# Patient Record
Sex: Male | Born: 1945 | Race: White | Hispanic: No | Marital: Single | Smoking: Never smoker
Health system: Southern US, Community
[De-identification: ages and names within clinical notes are randomized; demographics above are authoritative.]

## PROBLEM LIST (undated history)

## (undated) DIAGNOSIS — C4492 Squamous cell carcinoma of skin, unspecified: Secondary | ICD-10-CM

## (undated) DIAGNOSIS — M1990 Unspecified osteoarthritis, unspecified site: Secondary | ICD-10-CM

## (undated) DIAGNOSIS — I2510 Atherosclerotic heart disease of native coronary artery without angina pectoris: Secondary | ICD-10-CM

## (undated) DIAGNOSIS — Z87442 Personal history of urinary calculi: Secondary | ICD-10-CM

## (undated) DIAGNOSIS — I10 Essential (primary) hypertension: Secondary | ICD-10-CM

## (undated) DIAGNOSIS — C801 Malignant (primary) neoplasm, unspecified: Secondary | ICD-10-CM

## (undated) HISTORY — PX: WRIST SURGERY: SHX841

## (undated) HISTORY — PX: NECK MASS EXCISION: SHX2079

## (undated) HISTORY — PX: TONSILLECTOMY: SUR1361

## (undated) HISTORY — PX: ROTATOR CUFF REPAIR: SHX139

## (undated) HISTORY — PX: HIP ARTHROPLASTY: SHX981

---

## 1998-11-29 ENCOUNTER — Encounter: Payer: Self-pay | Admitting: Internal Medicine

## 1998-11-29 ENCOUNTER — Emergency Department (HOSPITAL_COMMUNITY): Admission: EM | Admit: 1998-11-29 | Discharge: 1998-11-29 | Payer: Self-pay | Admitting: Internal Medicine

## 2004-10-02 DIAGNOSIS — C210 Malignant neoplasm of anus, unspecified: Secondary | ICD-10-CM

## 2004-10-02 HISTORY — DX: Malignant neoplasm of anus, unspecified: C21.0

## 2004-11-22 ENCOUNTER — Ambulatory Visit: Payer: Self-pay | Admitting: Hematology and Oncology

## 2004-11-23 ENCOUNTER — Ambulatory Visit: Admission: RE | Admit: 2004-11-23 | Discharge: 2005-01-29 | Payer: Self-pay | Admitting: *Deleted

## 2004-11-25 ENCOUNTER — Encounter (INDEPENDENT_AMBULATORY_CARE_PROVIDER_SITE_OTHER): Payer: Self-pay | Admitting: Specialist

## 2004-11-25 ENCOUNTER — Ambulatory Visit (HOSPITAL_COMMUNITY): Admission: RE | Admit: 2004-11-25 | Discharge: 2004-11-25 | Payer: Self-pay | Admitting: General Surgery

## 2005-01-09 ENCOUNTER — Ambulatory Visit: Payer: Self-pay | Admitting: Hematology and Oncology

## 2005-02-08 ENCOUNTER — Ambulatory Visit (HOSPITAL_BASED_OUTPATIENT_CLINIC_OR_DEPARTMENT_OTHER): Admission: RE | Admit: 2005-02-08 | Discharge: 2005-02-08 | Payer: Self-pay | Admitting: *Deleted

## 2005-02-21 ENCOUNTER — Ambulatory Visit: Admission: RE | Admit: 2005-02-21 | Discharge: 2005-02-21 | Payer: Self-pay | Admitting: *Deleted

## 2005-02-24 ENCOUNTER — Ambulatory Visit: Payer: Self-pay | Admitting: Hematology and Oncology

## 2005-03-13 ENCOUNTER — Ambulatory Visit (HOSPITAL_COMMUNITY): Admission: RE | Admit: 2005-03-13 | Discharge: 2005-03-13 | Payer: Self-pay | Admitting: Hematology and Oncology

## 2005-03-15 ENCOUNTER — Ambulatory Visit (HOSPITAL_COMMUNITY): Admission: RE | Admit: 2005-03-15 | Discharge: 2005-03-15 | Payer: Self-pay | Admitting: Hematology and Oncology

## 2005-04-12 ENCOUNTER — Ambulatory Visit (HOSPITAL_COMMUNITY): Admission: RE | Admit: 2005-04-12 | Discharge: 2005-04-12 | Payer: Self-pay | Admitting: General Surgery

## 2005-04-12 ENCOUNTER — Encounter (INDEPENDENT_AMBULATORY_CARE_PROVIDER_SITE_OTHER): Payer: Self-pay | Admitting: Specialist

## 2005-04-20 ENCOUNTER — Ambulatory Visit: Payer: Self-pay | Admitting: Hematology and Oncology

## 2005-04-25 ENCOUNTER — Ambulatory Visit: Admission: RE | Admit: 2005-04-25 | Discharge: 2005-04-25 | Payer: Self-pay | Admitting: *Deleted

## 2005-07-06 ENCOUNTER — Ambulatory Visit: Payer: Self-pay | Admitting: Hematology and Oncology

## 2005-07-17 ENCOUNTER — Ambulatory Visit (HOSPITAL_COMMUNITY): Admission: RE | Admit: 2005-07-17 | Discharge: 2005-07-17 | Payer: Self-pay | Admitting: Hematology and Oncology

## 2005-10-02 DIAGNOSIS — D126 Benign neoplasm of colon, unspecified: Secondary | ICD-10-CM

## 2005-10-02 HISTORY — PX: COLONOSCOPY: SHX174

## 2005-10-02 HISTORY — DX: Benign neoplasm of colon, unspecified: D12.6

## 2005-11-08 ENCOUNTER — Ambulatory Visit: Payer: Self-pay | Admitting: Hematology and Oncology

## 2005-11-21 ENCOUNTER — Ambulatory Visit (HOSPITAL_COMMUNITY): Admission: RE | Admit: 2005-11-21 | Discharge: 2005-11-21 | Payer: Self-pay | Admitting: Hematology and Oncology

## 2005-12-01 ENCOUNTER — Ambulatory Visit (HOSPITAL_COMMUNITY): Admission: RE | Admit: 2005-12-01 | Discharge: 2005-12-01 | Payer: Self-pay | Admitting: Hematology and Oncology

## 2006-03-27 ENCOUNTER — Ambulatory Visit: Payer: Self-pay | Admitting: Internal Medicine

## 2006-04-10 ENCOUNTER — Ambulatory Visit: Payer: Self-pay | Admitting: Internal Medicine

## 2006-04-10 ENCOUNTER — Encounter (INDEPENDENT_AMBULATORY_CARE_PROVIDER_SITE_OTHER): Payer: Self-pay | Admitting: Specialist

## 2006-04-18 ENCOUNTER — Ambulatory Visit: Payer: Self-pay | Admitting: Hematology and Oncology

## 2006-04-23 LAB — CBC WITH DIFFERENTIAL/PLATELET
BASO%: 0.3 % (ref 0.0–2.0)
EOS%: 0.8 % (ref 0.0–7.0)
HCT: 39.9 % (ref 38.7–49.9)
LYMPH%: 23.7 % (ref 14.0–48.0)
MCH: 31.3 pg (ref 28.0–33.4)
MCHC: 34.2 g/dL (ref 32.0–35.9)
MCV: 91.6 fL (ref 81.6–98.0)
MONO%: 7.7 % (ref 0.0–13.0)
NEUT%: 67.5 % (ref 40.0–75.0)
Platelets: 162 10*3/uL (ref 145–400)
lymph#: 0.9 10*3/uL (ref 0.9–3.3)

## 2006-04-23 LAB — COMPREHENSIVE METABOLIC PANEL
ALT: 20 U/L (ref 0–40)
AST: 18 U/L (ref 0–37)
Alkaline Phosphatase: 131 U/L — ABNORMAL HIGH (ref 39–117)
BUN: 24 mg/dL — ABNORMAL HIGH (ref 6–23)
Creatinine, Ser: 1.37 mg/dL (ref 0.40–1.50)
Total Bilirubin: 0.4 mg/dL (ref 0.3–1.2)

## 2006-04-27 ENCOUNTER — Ambulatory Visit (HOSPITAL_COMMUNITY): Admission: RE | Admit: 2006-04-27 | Discharge: 2006-04-27 | Payer: Self-pay | Admitting: Hematology and Oncology

## 2006-08-29 ENCOUNTER — Ambulatory Visit: Payer: Self-pay | Admitting: Hematology and Oncology

## 2006-08-31 LAB — COMPREHENSIVE METABOLIC PANEL
ALT: 39 U/L (ref 0–53)
AST: 25 U/L (ref 0–37)
CO2: 29 mEq/L (ref 19–32)
Chloride: 101 mEq/L (ref 96–112)
Sodium: 143 mEq/L (ref 135–145)
Total Bilirubin: 0.5 mg/dL (ref 0.3–1.2)
Total Protein: 7.1 g/dL (ref 6.0–8.3)

## 2006-08-31 LAB — CBC WITH DIFFERENTIAL/PLATELET
BASO%: 0.3 % (ref 0.0–2.0)
LYMPH%: 22.8 % (ref 14.0–48.0)
MCHC: 34.4 g/dL (ref 32.0–35.9)
MONO#: 0.4 10*3/uL (ref 0.1–0.9)
RBC: 4.74 10*6/uL (ref 4.20–5.71)
WBC: 4.9 10*3/uL (ref 4.0–10.0)
lymph#: 1.1 10*3/uL (ref 0.9–3.3)

## 2006-08-31 LAB — CEA: CEA: 1.2 ng/mL (ref 0.0–5.0)

## 2006-11-22 ENCOUNTER — Encounter: Admission: RE | Admit: 2006-11-22 | Discharge: 2006-11-22 | Payer: Self-pay | Admitting: Orthopedic Surgery

## 2006-11-27 ENCOUNTER — Ambulatory Visit (HOSPITAL_COMMUNITY): Admission: RE | Admit: 2006-11-27 | Discharge: 2006-11-28 | Payer: Self-pay | Admitting: Orthopedic Surgery

## 2007-01-25 ENCOUNTER — Ambulatory Visit: Payer: Self-pay | Admitting: Hematology and Oncology

## 2007-01-30 LAB — CBC WITH DIFFERENTIAL/PLATELET
BASO%: 0.1 % (ref 0.0–2.0)
Basophils Absolute: 0 10*3/uL (ref 0.0–0.1)
EOS%: 0.5 % (ref 0.0–7.0)
HCT: 41.1 % (ref 38.7–49.9)
HGB: 14.5 g/dL (ref 13.0–17.1)
LYMPH%: 21.4 % (ref 14.0–48.0)
MCH: 32 pg (ref 28.0–33.4)
MCHC: 35.2 g/dL (ref 32.0–35.9)
NEUT%: 70.2 % (ref 40.0–75.0)
Platelets: 140 10*3/uL — ABNORMAL LOW (ref 145–400)

## 2007-01-30 LAB — COMPREHENSIVE METABOLIC PANEL
ALT: 35 U/L (ref 0–53)
AST: 24 U/L (ref 0–37)
BUN: 29 mg/dL — ABNORMAL HIGH (ref 6–23)
CO2: 29 mEq/L (ref 19–32)
Calcium: 9.5 mg/dL (ref 8.4–10.5)
Chloride: 101 mEq/L (ref 96–112)
Creatinine, Ser: 1.24 mg/dL (ref 0.40–1.50)
Total Bilirubin: 0.6 mg/dL (ref 0.3–1.2)

## 2007-02-01 ENCOUNTER — Encounter: Payer: Self-pay | Admitting: Hematology and Oncology

## 2007-02-01 ENCOUNTER — Inpatient Hospital Stay (HOSPITAL_COMMUNITY): Admission: AD | Admit: 2007-02-01 | Discharge: 2007-02-04 | Payer: Self-pay | Admitting: Orthopedic Surgery

## 2007-02-18 ENCOUNTER — Encounter: Admission: RE | Admit: 2007-02-18 | Discharge: 2007-02-18 | Payer: Self-pay | Admitting: Orthopedic Surgery

## 2007-03-05 ENCOUNTER — Inpatient Hospital Stay (HOSPITAL_COMMUNITY): Admission: RE | Admit: 2007-03-05 | Discharge: 2007-03-08 | Payer: Self-pay | Admitting: Orthopedic Surgery

## 2007-03-05 ENCOUNTER — Encounter (INDEPENDENT_AMBULATORY_CARE_PROVIDER_SITE_OTHER): Payer: Self-pay | Admitting: Orthopedic Surgery

## 2007-04-26 ENCOUNTER — Inpatient Hospital Stay (HOSPITAL_COMMUNITY): Admission: RE | Admit: 2007-04-26 | Discharge: 2007-04-29 | Payer: Self-pay | Admitting: Orthopedic Surgery

## 2007-07-26 ENCOUNTER — Ambulatory Visit: Payer: Self-pay | Admitting: Hematology and Oncology

## 2007-07-30 LAB — CBC WITH DIFFERENTIAL/PLATELET
Basophils Absolute: 0 10*3/uL (ref 0.0–0.1)
HCT: 40.2 % (ref 38.7–49.9)
HGB: 14 g/dL (ref 13.0–17.1)
LYMPH%: 22.2 % (ref 14.0–48.0)
MCHC: 34.9 g/dL (ref 32.0–35.9)
MONO#: 0.4 10*3/uL (ref 0.1–0.9)
NEUT%: 68.4 % (ref 40.0–75.0)
Platelets: 162 10*3/uL (ref 145–400)
WBC: 4.6 10*3/uL (ref 4.0–10.0)
lymph#: 1 10*3/uL (ref 0.9–3.3)

## 2007-07-30 LAB — COMPREHENSIVE METABOLIC PANEL
ALT: 22 U/L (ref 0–53)
BUN: 23 mg/dL (ref 6–23)
CO2: 29 mEq/L (ref 19–32)
Calcium: 10 mg/dL (ref 8.4–10.5)
Chloride: 101 mEq/L (ref 96–112)
Creatinine, Ser: 1.11 mg/dL (ref 0.40–1.50)
Glucose, Bld: 100 mg/dL — ABNORMAL HIGH (ref 70–99)
Total Bilirubin: 0.6 mg/dL (ref 0.3–1.2)

## 2007-07-30 LAB — LACTATE DEHYDROGENASE: LDH: 152 U/L (ref 94–250)

## 2007-08-01 ENCOUNTER — Ambulatory Visit (HOSPITAL_COMMUNITY): Admission: RE | Admit: 2007-08-01 | Discharge: 2007-08-01 | Payer: Self-pay | Admitting: Hematology and Oncology

## 2007-08-23 IMAGING — CT CT 3D INDEPENDENT WKST
3 of 4 series · 16 of 35 positions shown, 19 images · IV contrast (agent unspecified)
Comparison: none

CLINICAL DATA: Left wrist fracture.  Preoperative planning.
CT OF THE LEFT WRIST WITHOUT CONTRAST:
TECHNIQUE: Multidetector CT imaging was performed according to the standard protocol.  Multiplanar CT image reconstructions were also generated.

[Series 102: upper ext · axial · 0.35mm/px · z∈[+127,+247]mm · 8 of 369 slices shown, 10 images]
[im 34/369  soft-tissue]
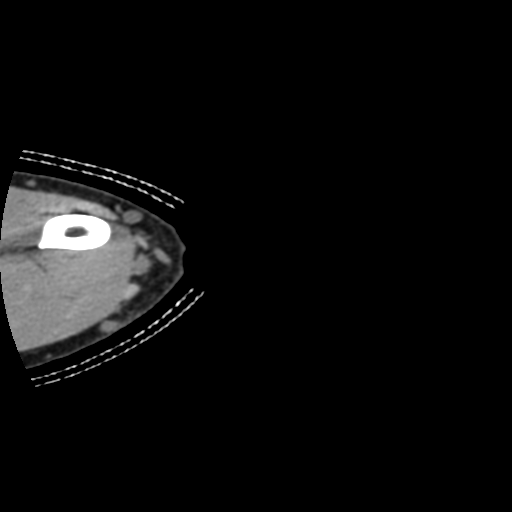
[im 34/369  bone]
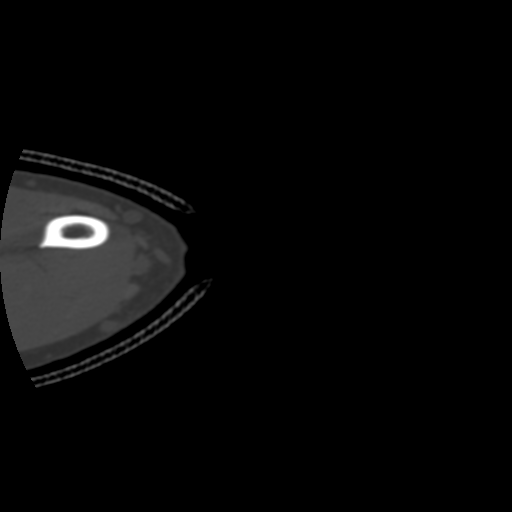
[im 67/369  bone]
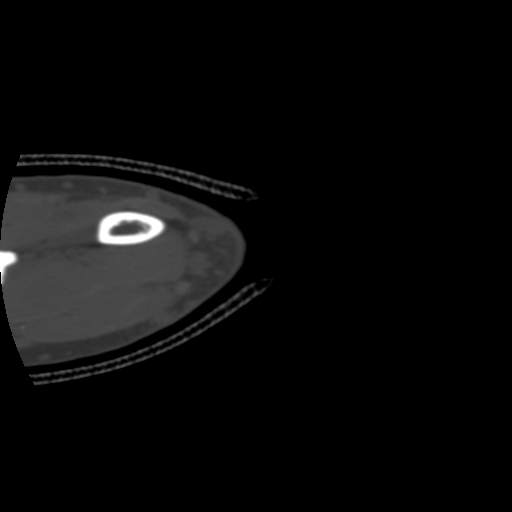
[im 134/369  bone]
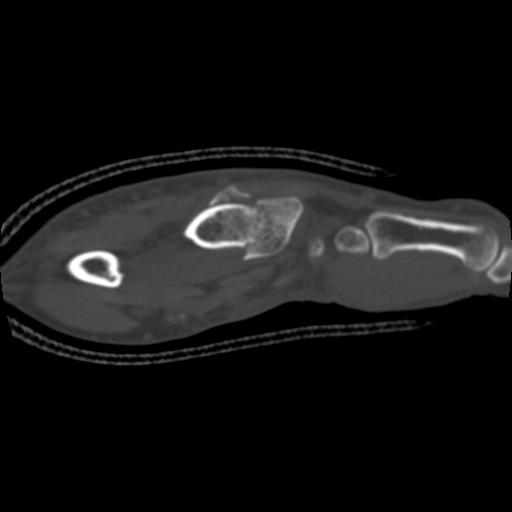
[im 168/369  bone]
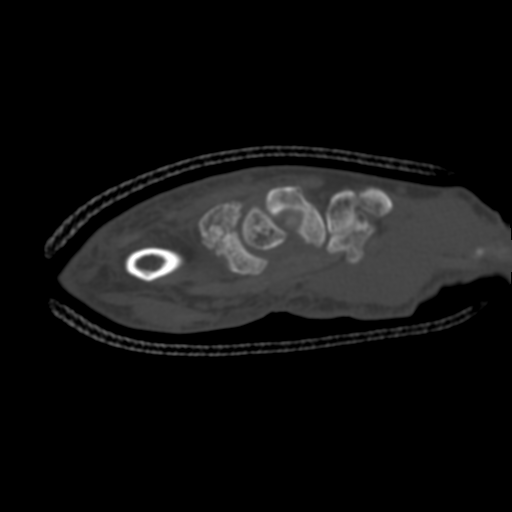
[im 201/369  soft-tissue]
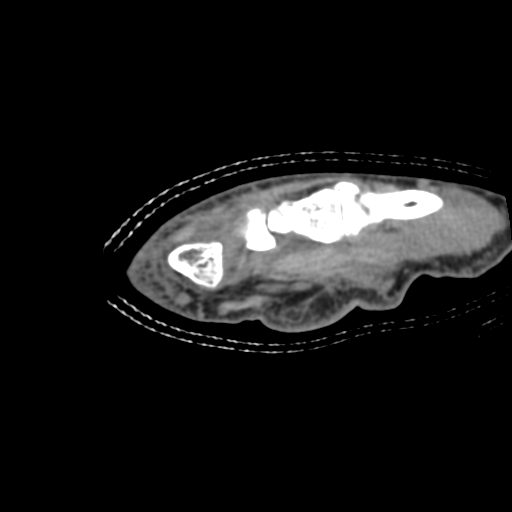
[im 201/369  bone]
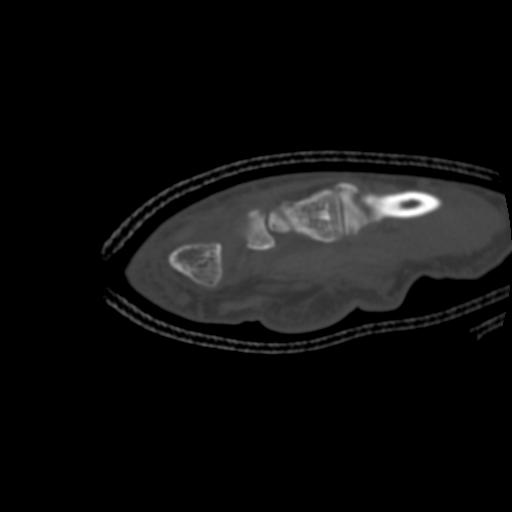
[im 235/369  bone]
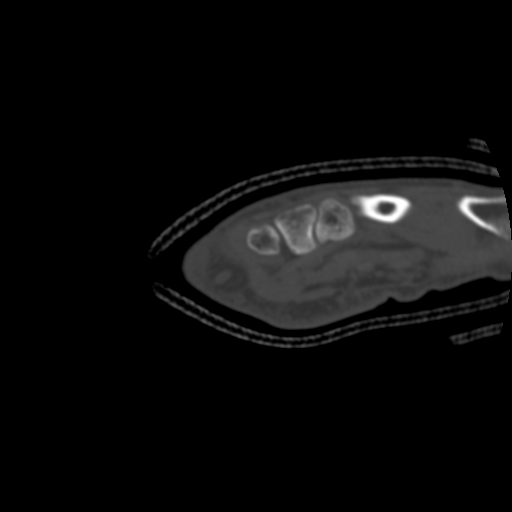
[im 302/369  bone]
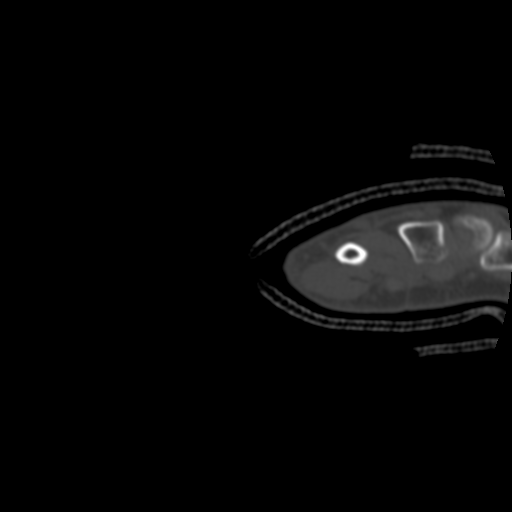
[im 335/369  bone]
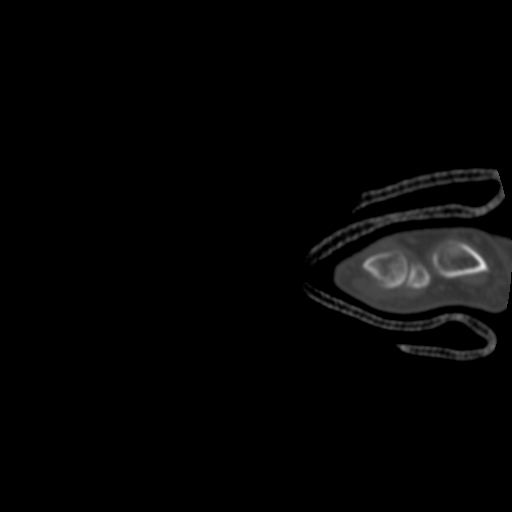

[Series 200: axial lt wrist · sagittal · 0.35mm/px · 5 of 60 slices shown, 6 images]
[im 20/60  bone]
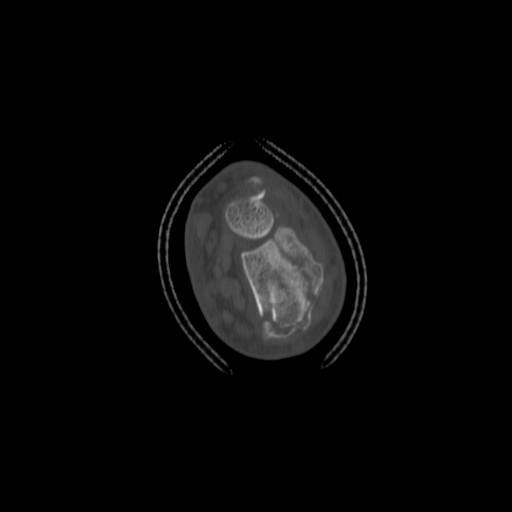
[im 25/60  bone]
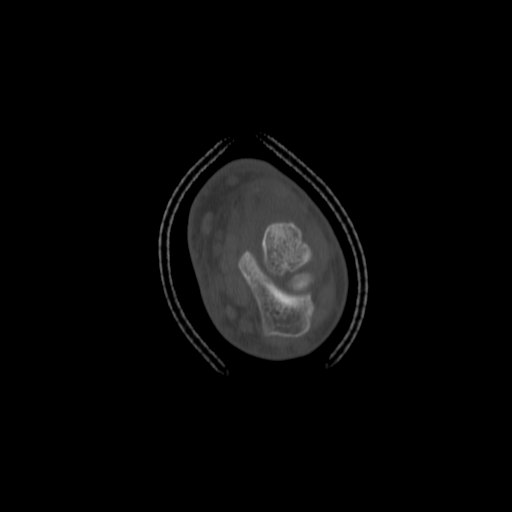
[im 30/60  soft-tissue]
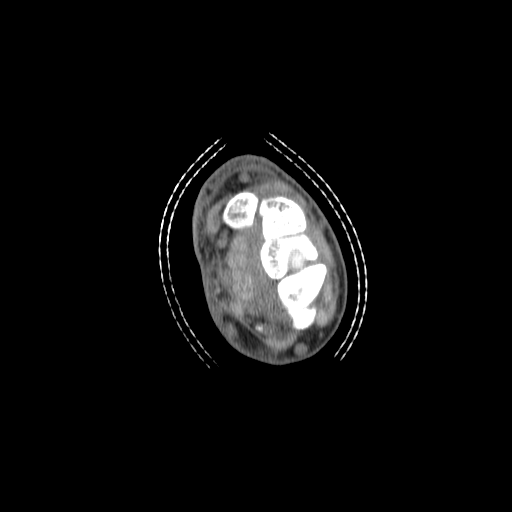
[im 30/60  bone]
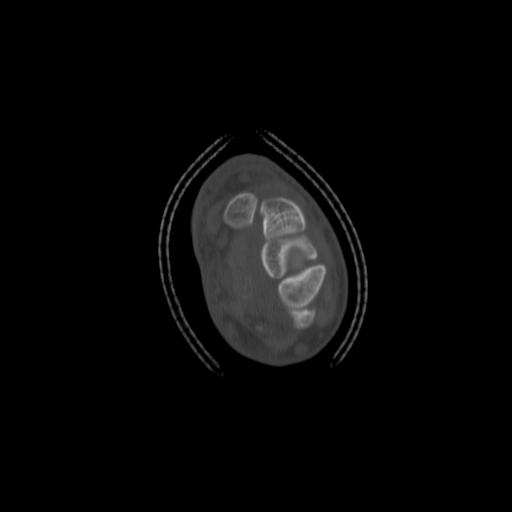
[im 35/60  bone]
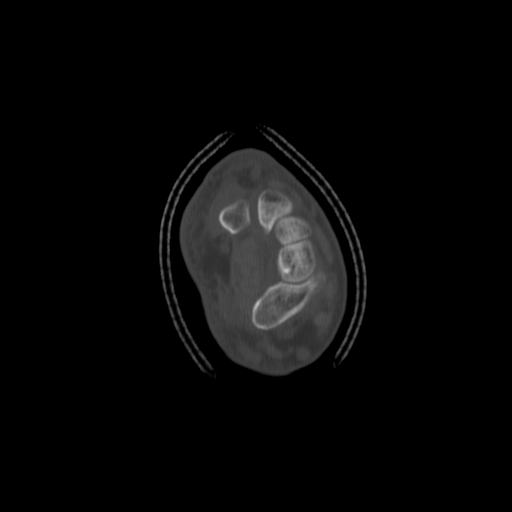
[im 40/60  bone]
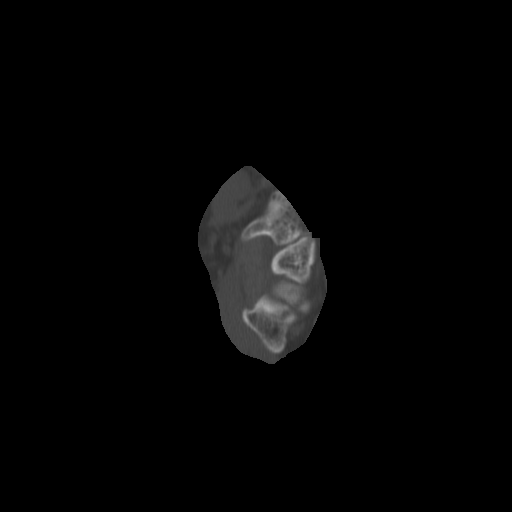

[Series 601: coronal bone · coronal · 0.35mm/px · 3 of 60 slices shown]
[im 12/60  bone]
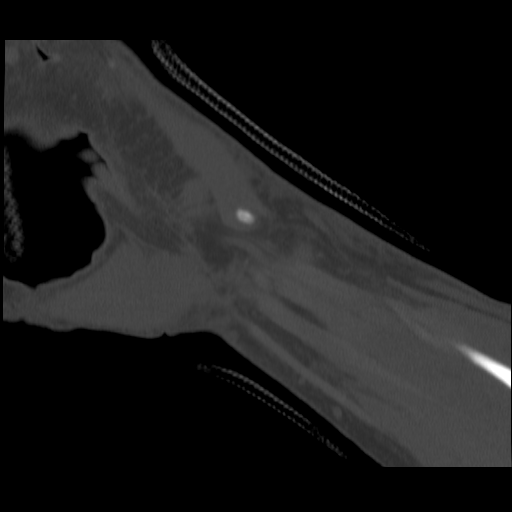
[im 24/60  bone]
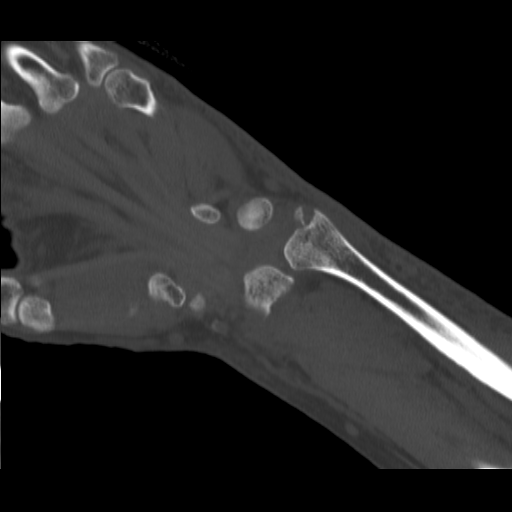
[im 36/60  bone]
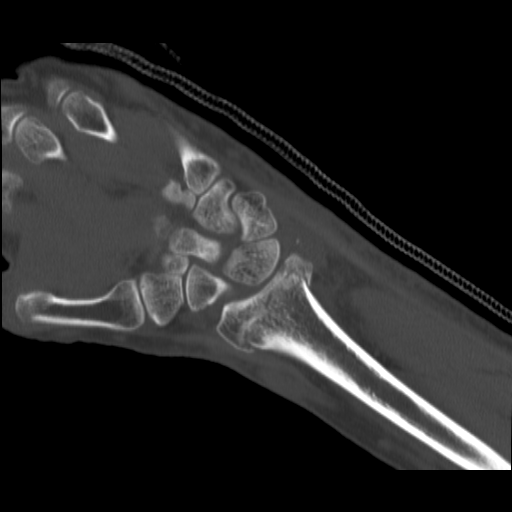

[16 of 35 positions shown; findings below may reference images not displayed]

FINDINGS: There is a fracture of the ulnar styloid which is displaced 2 to 3 mm.  There is a comminuted fracture of the distal radius.  The distal radial articular surface shows only very minimal dorsal tilt.  The fracture line does extend to the articular surface.  There is slight impaction of the fracture fragments.  I do not see a fracture of the carpal bones.  I do not see any widening of the spaces between the proximal row.  The proximal metacarpals appear normal.
IMPRESSION: 1.  Minimally displaced fracture of the ulnar styloid. 
2.  Comminuted fracture of the distal radius.  Only a few degrees of dorsal tilt of the distal radial articular surface.  The articular surface is largely intact with only one fracture line extending to it in a fairly smooth nature.  
3-DIMENSIONAL CT IMAGE RENDERING ON INDEPENDENT WORKSTATION:
3-dimensional CT images were rendered by post-processing of the original CT data on an independent workstation.  The 3-dimensional CT images were interpreted, and findings were reported in the accompanying complete CT report for this study.

## 2008-02-11 ENCOUNTER — Ambulatory Visit: Payer: Self-pay | Admitting: Hematology and Oncology

## 2010-10-23 ENCOUNTER — Encounter: Payer: Self-pay | Admitting: Hematology and Oncology

## 2011-02-14 NOTE — Op Note (Signed)
NAME:  Anthony, Grant NO.:  [id number]   MEDICAL RECORD NO.:  [id number]          PATIENT TYPE:  INP   LOCATION:  1608                         FACILITY:  [facility]   PHYSICIAN:  Madlyn Frankel. Charlann Boxer, M.D.  DATE OF BIRTH:  01/08/1946   DATE OF PROCEDURE:  04/26/2007  DATE OF DISCHARGE:                               OPERATIVE REPORT   PREOPERATIVE DIAGNOSIS:  Failed left hip surgery after femoral neck  fracture and percutaneous cannulated screw fixation.   POSTOPERATIVE DIAGNOSIS:  Failed left hip surgery after femoral neck  fracture and percutaneous cannulated screw fixation.   PROCEDURE:  Conversion of previous left hip surgery to a left total hip  replacement.   COMPONENTS USED:  DePuy hip system with a size 56 Pinnacle cup, two  cancellous bone screws, 36 metal liner, size 4 standard Trilock stem  with a 36 +5 Delta ceramic ball.   SURGEON:  Madlyn Frankel. Charlann Boxer, M.D.   ASSISTANT:  [name]. Mann, P.A.-C.   ANESTHESIA:  General.   ESTIMATED BLOOD LOSS:  400 mL.   DRAINS:  None.   COMPLICATIONS:  None.   INDICATIONS FOR PROCEDURE:  [name] is a 65 year old patient known  to me for insufficiency fractures.  This may be related to chemotherapy  type treatment for colon cancer.  He had had an insufficiency fracture  on this left side that was treated with a cannulated screw fixation.  He  has had persistent with problems afterwards.  He is over four months  out.  In the interim, he developed a right hip insufficiency fracture.  After discussing with him his current treatment options at the time, he  wished to proceed with hip replacement as an option.  His right hip has  subsequently done extremely well six weeks out without pain.  His left  hip has persistently bothered him and he wished to proceed with  converting to hip replacement surgery.  The risks and benefits were  reviewed as he had proceeded with the right side in the past.  Consent  was  obtained.   PROCEDURE IN DETAIL:  The patient was brought to the operating theater.  Once adequate anesthesia and preoperative antibiotics, Ancef, were  administered, the patient was positioned in the right lateral decubitus  position with the left side up.  The left lower extremity was pre-  scrubbed and then prepped and draped in a sterile fashion.  A lateral  type incision was made fore a posterior approach to the hip.  I extended  it a little bit longer down the shaft of the femur in order to remove  the screws.   Attention was first directed to hip exposure.  The short external  rotators were identified and taken down from the posterior capsule and  capsulotomy was carried out preserving the posterior leaflet to repair  anatomically the superior leaflet.  With the hip exposed, I did  dislocate the hip at this point before removing the hardware to make  sure I did not create a stress fracture by removing the hardware.  There  was no significant  motion through this fracture site.  However, the  femoral head was noted to have some condylar change.   Following dislocation of the hip, I did reduce it.  I then attended to  removing the screws.  The screws were digitally palpated and a small  incision made in the vastus lateralis fascia.  The screws were removed  without difficulty.  At this point, the hip was dislocated.  The neck  osteotomy was subsequently made based on anatomical landmarks and using  the broach of the template.  I then began broaching the femur after  reaming the canal to prevent fat emboli.  The femur was initially  broached to a size 4 with approximately 25 degrees of anteversion.  I  then kept the stem in place to protect the stem from the retractors.  Attention was now directed to the acetabulum.  Following acetabular  exposure and labrectomy, reaming commenced with a 45 reamer.  I carried  this all the way up to the 55 reamer until I got a good bony purchase.   At this point, there was excellent bony bed preparation with cancellous  bone.  A size 56 Pinnacle cup was then impacted at 35 degrees of  abduction and 20 degrees of forward flexion.  Two cancellous screws were  used to supplement this fixation.  A neutral liner trial was placed.  Trial reduction was carried out with the size 4 femur with the 36 +5  ball.  There was some thought to using a 40 metal liner and there was  none available at this time, so we used a 36 liner, this is what we used  on the contralateral hip.  The hip appeared to be very stable and  compared to the contralateral leg, the leg lengths appeared to be equal.  There was very stable range of motion with internal rotation up to 70-80  degrees without any impingement or subluxation.   At this point, all trial components were removed.  The hole eliminator  was placed followed by the 36 neutral metal liner.  I then impacted the  size 4 standard Trilock stem to the level of the neck cut.  The 36 trial  was then carried out again to assure stability.  Given this, the final  36 +5 ball was impacted into a clean and dry trunnion and the hip  reduced.  The hip was irrigated throughout the case and again at this  point.  I then reapproximated the posterior capsule to the superior leaf  with #1 Ethibond.  5 mL of FloSeal was injected in the capsular tissue.  I then reapproximated the iliotibial band using a running #1 Vicryl.  A  #1 Vicryl was run through the gluteal fascia.  The remainder of the  wound was closed in layers with 2-0 Vicryl and running 4-0 Monocryl.  The hip was cleaned, dried, and dressed sterilely with a Mepilex  dressing after Steri-Strips.  The patient was brought to the recovery  room extubated in stable condition.      Madlyn Frankel Charlann Boxer, M.D.  Electronically Signed     MDO/MEDQ  D:  04/26/2007  T:  04/27/2007  Job:  [id number]

## 2011-02-14 NOTE — Discharge Summary (Signed)
NAME:  [name], [name] NO.:  [id number]   MEDICAL RECORD NO.:  [id number]          PATIENT TYPE:  INP   LOCATION:  5010                         FACILITY:  [facility]   PHYSICIAN:  Madlyn Frankel. Charlann Boxer, M.D.  DATE OF BIRTH:  08-05-1946   DATE OF ADMISSION:  02/01/2007  DATE OF DISCHARGE:  02/04/2007                               DISCHARGE SUMMARY   ADMISSION DIAGNOSES:  1. Right hip pain, rule out stress fracture.  2. Colon cancer.  3. Hypertension.  4. Hypercholesterolemia.   DISCHARGE DIAGNOSES:  1. Right hip pain with no stress fracture.  Muscle tear found on MRI.  2. Colon cancer.  3. Hypertension.  4. Hypercholesterolemia.   CONSULTING PHYSICIAN:  Interventional radiology for hip joint  aspiration.   HISTORY OF PRESENT ILLNESS:  Mr. [name] is a 65 year old gentleman who  had a fall on his left hip recently where an MRI revealed a stress  fracture in his left hip.  He had a closed reduction and percutaneous  screw fixation of his left hip.  He has done fairly well with this and  had a rapid onset of increased right hip pain, unable to bear weight and  really function.  He has been using crutches.  After presenting to our  office on May 2nd and reporting his history, Dr. Charlann Boxer had him admitted  to rule out stress fracture treatment with increasing pain with  weightbearing.   LABORATORY DATA:  CBC on admission:  Hematocrit 40.2, white blood cells  5.6.  Coagulation:  INR is 1.1, PT was 14.3.  Chemistry:  His glucose is  100, his BUN was 25.  Kidney function:  GFR was at 52, calcium 9.5.  Synovial fluid cell count from right hip showed hazy appearance, 92  monocyte and macrophages.  Right hip cultures:  No organisms, rare blood  cells, predominantly mononuclear.   Cardiology:  EKG normal sinus rhythm.  Radiology:  Fluoroscopy utilized  using surgery.  MRI right hip without contrast; impression:  1. Unusual marrow signal abnormalities involving the right hip and      right acetabulum.  These might be bone infarcts and may be related      to previous radiation, no definite fracture, avascular necrosis.  2. Marked edema-like signal changes in fluid along the obturator      externus muscle, which is likely a muscle tear and may account for      this patient's hip pain.  There is also a small right hip joint      effusion.  Right hip joint arthrocentesis under fluoroscopic      guidance; impression showed that it yielded 4 mL of clear synovial      fluid for test.   HOSPITAL COURSE:  The patient admitted under Dr. Nilsa Nutting service.  The  MRI was performed on his first day in.  Asked for right hip aspiration  to rule out infection.  If this was negative, then a predinsone 12-pack  would be prescribed.  He remained afebrile throughout his course of  stay.  On the 4th when we saw him,  he was okay, but was still hurting in  his right hip.  After negative culture, prednisone was started.  By the  next day, he was feeling better and was ready for discharge home with  the use of the prednisone dosepak.   DISCHARGE DISPOSITION:  Discharged home in stable and improved condition  without infection.   DISCHARGE ACTIVITY:  Weightbearing as tolerated on his right hip.   DISCHARGE MEDICATIONS:  1. Atenolol 25 mg one p.o. daily.  2. Simvastatin 4 mg one p.o. daily.  3. HCTZ 25 mg one p.o. daily.  4. Avelox 400 mg one p.o. daily.  5. Durahist 45 mg p.o. daily.  6. Predinsone 5 mg 12-day dosepak to take as directed.   DISCHARGE INSTRUCTIONS:  Follow-up with Dr. Charlann Boxer, [phone], in three  weeks.     ______________________________  [name] [address], [place]      Madlyn Frankel. Charlann Boxer, M.D.  Electronically Signed    BLM/MEDQ  D:  04/15/2007  T:  04/16/2007  Job:  [id number]

## 2011-02-14 NOTE — H&P (Signed)
NAME:  [name], [name] NO.:  [id number]   MEDICAL RECORD NO.:  [id number]        PATIENT TYPE:  LINP   LOCATION:                               FACILITY:  [facility]   PHYSICIAN:  Madlyn Frankel. Charlann Boxer, M.D.  DATE OF BIRTH:  1945/11/01   DATE OF ADMISSION:  04/26/2007  DATE OF DISCHARGE:                              HISTORY & PHYSICAL   PROCEDURE:  Conversion of left hip pinning to left total hip  arthroplasty.   HISTORY OF PRESENT ILLNESS:  Mr. [name] is a 65 year old male with a  history of a left hip insufficiency fracture with left hip pinning.  Since this, he has had significant amount of groin pain, pain with  ambulation.  It has been persistent, progressive, and has reached the  point to where it has significantly affected his ability to perform  activities of daily living.  Conservative treatments have failed.  He  has been presurgically assessed for a left total hip replacement as he  has recently also had a right total hip replacement as well.   PAST MEDICAL HISTORY:  1. Status post right total hip replacement.  2. Osteoporosis.  3. Osteopenia.  4. Colon cancer.  5. Hypertension.  6. Hypercholesteremia.   PAST SURGICAL HISTORY:  Left hip percutaneous screw in March 2008.   FAMILY HISTORY:  Noncontributory.   SOCIAL HISTORY:  He is single.  Primary caregiver:  Home health care  versus skilled nursing facility rehab.   ALLERGIES:  NO KNOWN DRUG ALLERGIES.   MEDICATIONS:  1. Atenolol 25 mg daily.  2. Simvastatin 40 mg daily.  3. HCTZ 25 mg daily.   REVIEW OF SYSTEMS:  MUSCULOSKELETAL:  Left wrist fracture and fixation  on Feb 19, 2007 by Dr. Melvyn Grant.  Right total hip replacement March 05, 2007 by Dr. Durene Romans.  Otherwise see HPI.   PHYSICAL EXAMINATION:  Pulse 72, respirations 18, blood pressure 124/88.  GENERAL:  Awake, alert and oriented, well developed, well nourished, no  acute distress.  NECK:  Supple.  No carotid bruits.  CHEST/LUNGS:   Clear to auscultation bilaterally.  BREASTS:  Deferred.  HEART:  Regular rate and rhythm without gallops, clicks, rubs or  murmurs.  ABDOMEN:  Soft, nontender, nondistended.  Bowel sounds present.  GENITOURINARY:  Deferred.  LEFT LOWER EXTREMITY:  Painful to hip internal range of motion.  Lateral-  sided numbness.  SKIN:  Intact.  No cellulitis.  Dorsalis pedis pulse positive.  NEUROLOGIC:  Intact distal sensibilities.   LABS:  EKG, chest x-ray all pending presurgical clearance.   IMPRESSION:  Left hip degenerative changes associated with insufficiency  fracture and percutaneous screw fixation.   PLAN OF ACTION:  Conversion of left hip pinning to left total hip  arthroplasty on April 26, 2007 by surgeon Dr. Durene Romans.  Risks and  complications were discussed.  Questions were encouraged, answered, and  reviewed.     ______________________________  [name] [address], [place]      Madlyn Frankel. Charlann Boxer, M.D.  Electronically Signed    BLM/MEDQ  D:  04/25/2007  T:  04/26/2007  Job:  [id number] 

## 2011-02-14 NOTE — Op Note (Signed)
NAME:  [name], [name] NO.:  [id number]   MEDICAL RECORD NO.:  [id number]          PATIENT TYPE:  INP   LOCATION:  0005                         FACILITY:  [facility]   PHYSICIAN:  Madlyn Frankel. Charlann Boxer, M.D.  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   PROCEDURE:  Right total hip replacement.   COMPONENTS USED:  DePuy hip system, size 54 Pinnacle cup, 46 neutral  Marathon liner, 2 cancellous bone screws, Tri-Lock size 7 standard stem  with a 36 +1.5 Delta ceramic ball.   SURGEON:  Madlyn Frankel. Charlann Boxer, M.D.   ASSISTANT:  Dwyane Luo, P.A.   ANESTHESIA:  General.   BLOOD LOSS:  250.   DRAINS:  Zero.   COMPLICATIONS:  None.   INDICATIONS FOR PROCEDURE:  [name] is a 65 year old gentleman who  has a history of colon cancer.  He has been a patient of mine now for  currently bilateral insufficiency fractures.  He has had a history of  internal fixation and stabilization of a femoral neck fracture on the  left side, but he has got at least delayed if not nonunion in this area.  He has had progressive pain as he was recovering from this and he  developed right hip pain and the same sort of pattern happen.  He had  evidence of edema within the acetabulum and femoral head.  I discussed  with him treatment options at this point and based on his current  treatment and clinical course of his left hip, after reviewing the  options, he wishes to proceed with total hip replacement.  We discussed  the risks and benefits.  He is entertaining the idea of having the left  hip done as well.   Consent was obtained.  We discussed the risks of infection,  dislocation,  DVT and need for revision surgery.   PROCEDURE IN DETAIL:  The patient was brought to the operative theater.  Once adequate anesthesia and preoperative antibiotics, 2 grams Ancef,  were administered, the patient was positioned in the left lateral  decubitus position with the right side up.  We carefully positioned his  left upper extremity; he has had recent left distal radius and ulnar  fracture and currently in a long arm cast.   His right hip area was then prescrubbed and prepped and draped in a  sterile fashion.  The lateral-based incision was made for posterior  approach to the hip.  Iliotibial band and gluteus fascia were incised in  line with the incision posteriorly.  The short external rotators were  taken down separate from the posterior capsule.  An L capsulotomy was  made, preserving the posterior capsule for later anatomic repair as well  as to protect against the sciatic nerve with retractors.  At this point, the hip was flexed and internally rotated, displacing the  fracture quite easily.  There was not a significant amount of force  required for this.  It was noted to be more of a subcapital-type  pattern; for this reason, I went ahead and did a neck osteotomy into the  trochanteric fossa, removing this neck fragment and saving it to sent to  Pathology, given his history of colon cancer.  The femoral head was then  removed without difficulty using a corkscrew; it was sent for pathology  too, based on history of colon cancer, to make sure there was no  metastatic disease in this area.   At this point, attention was first directed to the femur.  Preparation  included use of a box osteotome set in anteversion at 20 degrees,  slightly bent, more so than his native position.  I then used the hand  reamer, opened up the canal, irrigated the canal to prevent fat emboli.  I then began broaching with size 1 and carried it all the way up  initially to a size 6.   Based on the angle of the broach but, I seat  this broach securely and then used a calcar miller to plane off this  neck region.  At this point, I packed off the femur and attended to the  acetabulum.  Acetabular exposure was carried out including labrectomy.  There were no significant ulcerations within the cartilage in this  acetabular side.  The acetabulum was reamed first with a 45 reamer and  then carried up sequentially up to 53.  At this point, I had very good  bone preparation and an anatomic position.  The final 54 cup was  impacted with good initial secure fit.  I did place 2 cancellous screw,  based on its insufficiency nature, to help stabilize initially the cup.   At this point, a neutral liner trial was placed.  Attention was then  redirected back to the femur.  I impacted the 6 stem down.  I went ahead  and did a trial reduction with it.  I used the standard offset neck and  as opposed to the high offset.  I trialed with the 36 +1.5 ball.  Leg  lengths appeared to be very equal compared the down leg pain.  He was  noted to have a little bit of lengthening on his right lower extremity  as compared to the left preop anyway, perhaps related to some of his  fracture fixation on the left side.   The hip was very stable in extension and external with no signs of  impingement.  In the sleep position, it was very stable with flexion,  neutral abduction and internal rotation.  There was some impingement on  the acetabulum at a full 90 degrees, but at 70 degrees of flexion and  internal rotation, there was good range of motion to about 60-70 degrees  with minimal subluxation.   At this point, I dislocated the hip and we opened up the metal liner.  Central hole eliminator was placed and the manhole cover placed.  I did  check the stability of the prosthesis and felt there was a little bit of rotation; for this reason, I went ahead and broached up to a size 7  broach, which sat at  the level of my new neck cut that was milled.   The final size 7 standard stem was then opened.  I did do a repeat  trial  reduction to make sure I was happy with the range of motion and once I  was convinced it was stable, I placed the final 36 +1.5 Delta ceramic  head.  Hip was irrigated throughout this case and again at this point.  I removed some of the soft tissues from the anterior aspect, which was  redundant-type capsule tissue.  I then reapproximated the posterior  capsule using #1 Ethibond.  The remainder of the iliotibial band was  reapproximated using #1 Ethibond and #1 Vicryl was used on gluteal  fascia.  The reaming wound was closed in layers with 2-0 Vicryl and a 4-0 running  Monocryl.  The patient was then brought to recovery room and extubated  stable condition.  The hip was cleaned, dried and dressed sterilely with  Steri-Strips and Mepilex dressing.      Madlyn Frankel Charlann Boxer, M.D.  Electronically Signed     MDO/MEDQ  D:  03/05/2007  T:  03/05/2007  Job:  [id number]

## 2011-02-14 NOTE — H&P (Signed)
NAME:  [name], OLGUIN NO.:  [id number]   MEDICAL RECORD NO.:  [id number]          PATIENT TYPE:  INP   LOCATION:  NA                           FACILITY:  [facility]   PHYSICIAN:  Madlyn Frankel. Charlann Boxer, M.D.  DATE OF BIRTH:  12-Oct-1945   DATE OF ADMISSION:  03/05/2007  DATE OF DISCHARGE:                              HISTORY & PHYSICAL   PROCEDURE:  Right total hip replacement.   HISTORY OF PRESENT ILLNESS:  Right hip groin pain.   HISTORY OF PRESENT ILLNESS:  A 65 year old male with a history of  persistent progressive right hip pain, was secondary to insufficiency  fracture.  It has been refractory to conservative treatments at this  point.  It has diminished his quality of life and he has been  presurgically assessed for right total hip replacement.   He does have a recent history of a left wrist fracture.  He has been  wearing a splint but it did have open reduction internal fixation on the  22nd by Dr. Melvyn Novas IV and should pose no problem for long-term success  of this surgery.   PAST MEDICAL HISTORY:  1. Osteoporosis.  2. Osteopenia.  3. Colon cancer.  4. Hypertension.  5. Hypercholesteremia.   PAST SURGICAL HISTORY:  Left hip percutaneous screw in March 2008.   FAMILY HISTORY:  Noncontributory.   SOCIAL HISTORY:  He is single.  Primary caregiver home health care or  skilled nursing facility rehab.   ALLERGIES:  NO KNOWN DRUG ALLERGIES.   MEDICATIONS:  1. Atenolol 25 mg daily.  2. Simvastatin 40 mg daily.  3. Hydrochlorothiazide 25 mg daily.   REVIEW OF SYSTEMS:  MUSCULOSKELETAL:  Left wrist fracture and fixation  on Feb 19, 2007, by Dr. Melvyn Novas IV.  Otherwise see HPI.   PHYSICAL EXAMINATION:  VITAL SIGNS:  Pulse 64, respirations 18, blood  pressure 124/88.  GENERAL:  Awake, alert and oriented, well-developed, well-nourished, no  acute distress.  Does have sling and cast splint on left arm.  NECK:  Supple.  No carotid bruits.  CHEST:  Lungs clear  to auscultation bilaterally.  BREASTS:  Deferred.  HEART:  Regular rate and rhythm without gallops, clicks, rubs or  murmurs.  ABDOMEN:  Soft, nontender, nondistended.  Bowel sounds present.  GENITOURINARY:  Deferred.  RIGHT LOWER EXTREMITY:  Painful hip internal range of motion.  SKIN:  Intact.  No cellulitis.  Dorsalis pedis pulse positive.  NEUROLOGIC:  Intact distal sensibilities.   Labs, EKG, chest x-ray all pending presurgical clearance.   IMPRESSION:  Right hip insufficiency fracture.   PLAN OF ACTION:  Right total hip replacement on March 05, 2007, by  surgeon, Dr. Durene Romans.  Risks and complications were discussed.  Questions were encouraged, answered and reviewed.     ______________________________  [name] [address], [place]      Madlyn Frankel. Charlann Boxer, M.D.  Electronically Signed    BLM/MEDQ  D:  03/01/2007  T:  03/01/2007  Job:  [id number]

## 2011-02-17 NOTE — Discharge Summary (Signed)
NAME:  [name], BAIL NO.:  [id number]   MEDICAL RECORD NO.:  [id number]          PATIENT TYPE:  INP   LOCATION:  1608                         FACILITY:  [facility]   PHYSICIAN:  Madlyn Frankel. Charlann Boxer, M.D.  DATE OF BIRTH:  [birth year]   DATE OF ADMISSION:  04/26/2007  DATE OF DISCHARGE:  04/29/2007                               DISCHARGE SUMMARY   ADMISSION DIAGNOSES:  1. Failed left hip surgery.  2. Status post right total hip replacement.  3. Osteoporosis.  4. Osteopenia.  5. Colon cancer.  6. Hypertension.  7. Hypercholesterolemia.   DISCHARGE DIAGNOSES:  1. Failed left hip pinning.  2. Status post right total hip replacement.  3. Osteoporosis.  4. Osteopenia.  5. Colon cancer.  6. Hypertension.  7. Hypercholesterolemia.  8. Postoperative hypokalemia.   CONSULTATIONS:  None.   PROCEDURE:  Conversion of left total hip replacement.   SURGEON:  Madlyn Frankel. Charlann Boxer, M.D.   ASSISTANT:  [name]. Mann, PA.   COMPLICATIONS:  Metal on ceramic.   BRIEF HISTORY OF PRESENT ILLNESS:  [name] is a 65 year old male  with a history of a left hip insufficiency fracture with a left hip  pinning.  Since then, he has had a significant amount of groin pain and  pain with ambulation.  It has been persistent and progressive.  He  reached the point of it significantly affecting his ability to perform  activities of daily living.  Pre-surgery he was assessed for left total  hip replacement, as he has also recently had a right total hip  replacement.   LABS PRE-ADMISSION:  CBC:  Hematocrit 40.8.  Postop day #1, 28.1.  Postop day #2, 26.6.  Postop day #3, 27.2 and stable.  Coags normal.  Routine chemistry:  Glucose 107 on admission, BUN 28, mild dehydration.   On postop day #1, mild hypokalemia at 3.3.  Glucose was 119.   On postop day #2, potassium came back up to 3.4, glucose at 112.  At  discharge, potassium 3.6, glucose 101 and stable.   Kidney function preadmission,  GFR 52.  Returned to normal at admission  to greater than 60.  GI workup normal.  UA negative.   RADIOLOGY:  Portable pelvis showed interval left total hip arthroplasty  with no adverse effects.   HOSPITAL COURSE:  Patient underwent conversion of left total hip  replacement.  Tolerated the procedure well.  Was admitted to the  orthopedic floor.  Remained neurovascularly intact throughout his course  of stay.  He remained afebrile throughout.  He was weightbearing as  tolerated with use of a rolling walker.  He progressed nicely during his  course of stay.  He was ambulating at least 150 feet prior to discharge.  He experienced some mild hypokalemia.  He was replenished and stable  upon discharge.  DVT prophylaxis with Lovenox was begun on postop day  #1.  The dressing was changed on a daily basis without significant  drainage from the wound.   On day #3, there were no complaints, ready to go.  Ambulated well.  DVT  prophylaxis  was underway.  He was stable.   DISCHARGE DISPOSITION:  Discharged home.   DISCHARGE WOUND CARE:  Keep dry.   DISCHARGE DIET:  Regular.   DISCHARGE FOLLOWUP:  With Dr. Charlann Boxer for a wound check in two weeks.   DISCHARGE MEDICATIONS:  1. Lovenox 40 mg subcu q.24h. x11 days.  2. Enteric-coated aspirin 325 mg p.o. daily x4 weeks after Lovenox      completed.  3. Robaxin 500 mg p.o. q.6h.  4. Colace 100 mg p.o. b.i.d.  5. MiraLax 17 gm p.o. daily.  6. Iron 325 mg p.o. t.i.d. x3 weeks.  7. Vicodin 5/325 1-2 p.o. q.4-6h. p.r.n. pain.  8. Atenolol 25 mg p.o. q.a.m.  9. HCTZ 25 mg 1 p.o. q.a.m.  10.Vitamin D 1000 mg p.o. q.a.m.  11.Vitamin B12 500 mcg p.o. q.a.m.  12.Simvastatin 40 mg p.o. daily.     ______________________________  [name] [address], [place]      Madlyn Frankel. Charlann Boxer, M.D.  Electronically Signed    BLM/MEDQ  D:  05/27/2007  T:  05/27/2007  Job:  [id number]

## 2011-07-17 LAB — CBC
HCT: 26.6 — ABNORMAL LOW
HCT: 40.8
Hemoglobin: 13.9
Hemoglobin: 9.4 — ABNORMAL LOW
Hemoglobin: 9.9 — ABNORMAL LOW
MCHC: 34
MCHC: 35.3
MCV: 88.3
MCV: 90.1
Platelets: 211
RBC: 3.17 — ABNORMAL LOW
RBC: 4.53
RDW: 15.4 — ABNORMAL HIGH
RDW: 15.7 — ABNORMAL HIGH
WBC: 5.3

## 2011-07-17 LAB — URINALYSIS, ROUTINE W REFLEX MICROSCOPIC
Bilirubin Urine: NEGATIVE
Nitrite: NEGATIVE
Specific Gravity, Urine: 1.024
Urobilinogen, UA: 0.2
pH: 5.5

## 2011-07-17 LAB — BASIC METABOLIC PANEL WITH GFR
BUN: 10
BUN: 8
CO2: 28
CO2: 30
Calcium: 8.4
Calcium: 8.8
Chloride: 100
Chloride: 102
Creatinine, Ser: 0.88
Creatinine, Ser: 0.92
GFR calc non Af Amer: >60
GFR calc non Af Amer: >60
Glucose, Bld: 101 — ABNORMAL HIGH
Glucose, Bld: 112 — ABNORMAL HIGH
Potassium: 3.4 — ABNORMAL LOW
Potassium: 3.6
Sodium: 136
Sodium: 137

## 2011-07-17 LAB — COMPREHENSIVE METABOLIC PANEL WITH GFR
ALT: 39
AST: 32
Albumin: 4.2
Alkaline Phosphatase: 115
BUN: 28 — ABNORMAL HIGH
CO2: 31
Calcium: 10.3
Chloride: 100
Creatinine, Ser: 1.38
GFR calc non Af Amer: 52 — ABNORMAL LOW
Glucose, Bld: 107 — ABNORMAL HIGH
Potassium: 3.6
Sodium: 142
Total Bilirubin: 0.9
Total Protein: 7.9

## 2011-07-17 LAB — PROTIME-INR
INR: 0.9
Prothrombin Time: 12.7

## 2011-07-17 LAB — BASIC METABOLIC PANEL
Calcium: 8.4
GFR calc Af Amer: >60
GFR calc non Af Amer: >60
Potassium: 3 — ABNORMAL LOW
Sodium: 135

## 2011-07-17 LAB — TYPE AND SCREEN
ABO/RH(D): O POS
Antibody Screen: NEGATIVE

## 2011-07-17 LAB — HEMOGLOBIN AND HEMATOCRIT, BLOOD: HCT: 27.2 — ABNORMAL LOW

## 2011-07-20 LAB — CBC
HCT: 33.1 — ABNORMAL LOW
Hemoglobin: 11.5 — ABNORMAL LOW
Hemoglobin: 12.4 — ABNORMAL LOW
MCHC: 34.7
MCV: 91.1
MCV: 91.4
RBC: 3.62 — ABNORMAL LOW
RBC: 3.94 — ABNORMAL LOW
RDW: 14.7 — ABNORMAL HIGH
WBC: 5.4

## 2011-07-20 LAB — BASIC METABOLIC PANEL
CO2: 31
CO2: 33 — ABNORMAL HIGH
Calcium: 9
Chloride: 96
Chloride: 97
Creatinine, Ser: 1.19
GFR calc Af Amer: >60
GFR calc Af Amer: >60
Glucose, Bld: 112 — ABNORMAL HIGH
Sodium: 134 — ABNORMAL LOW
Sodium: 138

## 2015-08-03 NOTE — H&P (Signed)
  [name] is an 69 y.o. male.    Chief Complaint: left shoulder pain  HPI: Pt is a 69 y.o. male complaining of left shoulder pain for multiple years. Pain had continually increased since the beginning. X-rays in the clinic show rotator cuff tear left shoulder. Pt has tried various conservative treatments which have failed to alleviate their symptoms, including injections and therapy. Various options are discussed with the patient. Risks, benefits and expectations were discussed with the patient. Patient understand the risks, benefits and expectations and wishes to proceed with surgery.   PCP:  No primary care provider on file.  D/C Plans: Home  PMH: No past medical history on file.  PSH: No past surgical history on file.  Social History:  has no tobacco, alcohol, and drug history on file.  Allergies:  Allergies not on file  Medications: No current facility-administered medications for this encounter.   No current outpatient prescriptions on file.    No results found for this or any previous visit (from the past 48 hour(s)). No results found.  ROS: Pain with rom of the left upper extremity  Physical Exam:  Alert and oriented 69 y.o. male in no acute distress Cranial nerves 2-12 intact Cervical spine: full rom with no tenderness, nv intact distally Chest: active breath sounds bilaterally, no wheeze rhonchi or rales Heart: regular rate and rhythm, no murmur Abd: non tender non distended with active bowel sounds Hip is stable with rom  Left upper extremity with moderate weakness with ER  No rashes or edema nv intact distally  Assessment/Plan Assessment: left rotator cuff tear   Plan: Patient will undergo a left rotator cuff repair by Dr. Veverly Fells at [facility]. Risks benefits and expectations were discussed with the patient. Patient understand risks, benefits and expectations and wishes to proceed.

## 2015-08-06 ENCOUNTER — Encounter (HOSPITAL_COMMUNITY)
Admission: RE | Admit: 2015-08-06 | Discharge: 2015-08-06 | Disposition: A | Discharge status: Home / Self Care | Payer: Non-veteran care | Source: Ambulatory Visit | Attending: Orthopedic Surgery | Admitting: Orthopedic Surgery

## 2015-08-06 ENCOUNTER — Encounter (HOSPITAL_COMMUNITY): Payer: Self-pay

## 2015-08-06 DIAGNOSIS — M75102 Unspecified rotator cuff tear or rupture of left shoulder, not specified as traumatic: Secondary | ICD-10-CM | POA: Insufficient documentation

## 2015-08-06 DIAGNOSIS — M19012 Primary osteoarthritis, left shoulder: Secondary | ICD-10-CM | POA: Diagnosis not present

## 2015-08-06 DIAGNOSIS — M94212 Chondromalacia, left shoulder: Secondary | ICD-10-CM | POA: Diagnosis not present

## 2015-08-06 DIAGNOSIS — I10 Essential (primary) hypertension: Secondary | ICD-10-CM | POA: Diagnosis not present

## 2015-08-06 DIAGNOSIS — Z01812 Encounter for preprocedural laboratory examination: Secondary | ICD-10-CM | POA: Diagnosis present

## 2015-08-06 DIAGNOSIS — Z79899 Other long term (current) drug therapy: Secondary | ICD-10-CM | POA: Diagnosis not present

## 2015-08-06 DIAGNOSIS — X58XXXA Exposure to other specified factors, initial encounter: Secondary | ICD-10-CM | POA: Diagnosis not present

## 2015-08-06 DIAGNOSIS — S43432A Superior glenoid labrum lesion of left shoulder, initial encounter: Secondary | ICD-10-CM | POA: Diagnosis not present

## 2015-08-06 HISTORY — DX: Malignant (primary) neoplasm, unspecified: C80.1

## 2015-08-06 HISTORY — DX: Essential (primary) hypertension: I10

## 2015-08-06 LAB — CBC
HEMATOCRIT: 41.1 % (ref 39.0–52.0)
HEMOGLOBIN: 13.6 g/dL (ref 13.0–17.0)
MCH: 31.2 pg (ref 26.0–34.0)
MCHC: 33.1 g/dL (ref 30.0–36.0)
MCV: 94.3 fL (ref 78.0–100.0)
Platelets: 130 10*3/uL — ABNORMAL LOW (ref 150–400)
RBC: 4.36 MIL/uL (ref 4.22–5.81)
RDW: 13.7 % (ref 11.5–15.5)
WBC: 3.7 10*3/uL — ABNORMAL LOW (ref 4.0–10.5)

## 2015-08-06 LAB — BASIC METABOLIC PANEL
ANION GAP: 5 (ref 5–15)
BUN: 21 mg/dL — ABNORMAL HIGH (ref 6–20)
CALCIUM: 9.8 mg/dL (ref 8.9–10.3)
CHLORIDE: 114 mmol/L — AB (ref 101–111)
CO2: 24 mmol/L (ref 22–32)
Creatinine, Ser: 1.29 mg/dL — ABNORMAL HIGH (ref 0.61–1.24)
GFR calc Af Amer: >60 mL/min (ref >60)
GFR calc non Af Amer: 55 mL/min — ABNORMAL LOW (ref >60)
GLUCOSE: 103 mg/dL — AB (ref 65–99)
Potassium: 4.2 mmol/L (ref 3.5–5.1)
Sodium: 143 mmol/L (ref 135–145)

## 2015-08-13 ENCOUNTER — Ambulatory Visit (HOSPITAL_COMMUNITY)
Admission: RE | Admit: 2015-08-13 | Discharge: 2015-08-13 | Disposition: A | Discharge status: Home / Self Care | Payer: Non-veteran care | Source: Ambulatory Visit | Attending: Orthopedic Surgery | Admitting: Orthopedic Surgery

## 2015-08-13 ENCOUNTER — Encounter (HOSPITAL_COMMUNITY)
Admission: RE | Disposition: A | Discharge status: Home / Self Care | Payer: Self-pay | Source: Ambulatory Visit | Attending: Orthopedic Surgery

## 2015-08-13 ENCOUNTER — Encounter (HOSPITAL_COMMUNITY): Payer: Self-pay | Admitting: General Practice

## 2015-08-13 ENCOUNTER — Ambulatory Visit (HOSPITAL_COMMUNITY): Payer: Non-veteran care | Admitting: Certified Registered Nurse Anesthetist

## 2015-08-13 DIAGNOSIS — M75102 Unspecified rotator cuff tear or rupture of left shoulder, not specified as traumatic: Secondary | ICD-10-CM | POA: Diagnosis not present

## 2015-08-13 DIAGNOSIS — M94212 Chondromalacia, left shoulder: Secondary | ICD-10-CM | POA: Insufficient documentation

## 2015-08-13 DIAGNOSIS — M19012 Primary osteoarthritis, left shoulder: Secondary | ICD-10-CM | POA: Diagnosis not present

## 2015-08-13 DIAGNOSIS — I10 Essential (primary) hypertension: Secondary | ICD-10-CM | POA: Insufficient documentation

## 2015-08-13 DIAGNOSIS — S43432A Superior glenoid labrum lesion of left shoulder, initial encounter: Secondary | ICD-10-CM | POA: Diagnosis not present

## 2015-08-13 DIAGNOSIS — X58XXXA Exposure to other specified factors, initial encounter: Secondary | ICD-10-CM | POA: Insufficient documentation

## 2015-08-13 DIAGNOSIS — Z79899 Other long term (current) drug therapy: Secondary | ICD-10-CM | POA: Insufficient documentation

## 2015-08-13 HISTORY — PX: SHOULDER ARTHROSCOPY WITH SUBACROMIAL DECOMPRESSION AND OPEN ROTATOR C: SHX5688

## 2015-08-13 SURGERY — SHOULDER ARTHROSCOPY WITH SUBACROMIAL DECOMPRESSION AND OPEN ROTATOR CUFF REPAIR, OPEN BICEPS TENDON REPAIR
Anesthesia: General | Site: Shoulder | Laterality: Left

## 2015-08-13 MED ORDER — LIDOCAINE-EPINEPHRINE (PF) 1.5 %-1:200000 IJ SOLN
INTRAMUSCULAR | Status: DC | PRN
  Administered 2015-08-13: 10 mL via PERINEURAL

## 2015-08-13 MED ORDER — CHLORHEXIDINE GLUCONATE 4 % EX LIQD: 60.0000 mL | Freq: Once | CUTANEOUS | Status: DC

## 2015-08-13 MED ORDER — METHOCARBAMOL 500 MG PO TABS: 500.0000 mg | ORAL_TABLET | Freq: Three times a day (TID) | ORAL | Status: DC | PRN

## 2015-08-13 MED ORDER — MIDAZOLAM HCL 2 MG/2ML IJ SOLN: 1.0000 mg | Freq: Once | INTRAMUSCULAR | Status: DC

## 2015-08-13 MED ORDER — FENTANYL CITRATE (PF) 100 MCG/2ML IJ SOLN
50.0000 ug | Freq: Once | INTRAMUSCULAR | Status: AC
  Administered 2015-08-13: 50 ug via INTRAVENOUS

## 2015-08-13 MED ORDER — EPHEDRINE SULFATE 50 MG/ML IJ SOLN
INTRAMUSCULAR | Status: AC
  Filled 2015-08-13: qty 1

## 2015-08-13 MED ORDER — MIDAZOLAM HCL 2 MG/2ML IJ SOLN
INTRAMUSCULAR | Status: AC
  Administered 2015-08-13: 1 mg
  Filled 2015-08-13: qty 2

## 2015-08-13 MED ORDER — KETOROLAC TROMETHAMINE 30 MG/ML IJ SOLN
30.0000 mg | Freq: Once | INTRAMUSCULAR | Status: AC
  Administered 2015-08-13: 30 mg via INTRAVENOUS

## 2015-08-13 MED ORDER — GLYCOPYRROLATE 0.2 MG/ML IJ SOLN
INTRAMUSCULAR | Status: DC | PRN
  Administered 2015-08-13: 0.4 mg via INTRAVENOUS
  Administered 2015-08-13: 0.2 mg via INTRAVENOUS

## 2015-08-13 MED ORDER — KETOROLAC TROMETHAMINE 30 MG/ML IJ SOLN
INTRAMUSCULAR | Status: AC
  Filled 2015-08-13: qty 1

## 2015-08-13 MED ORDER — CEFAZOLIN SODIUM-DEXTROSE 2-3 GM-% IV SOLR
2.0000 g | INTRAVENOUS | Status: AC
  Administered 2015-08-13: 2 g via INTRAVENOUS
  Filled 2015-08-13: qty 50

## 2015-08-13 MED ORDER — FENTANYL CITRATE (PF) 100 MCG/2ML IJ SOLN
INTRAMUSCULAR | Status: DC | PRN
Start: 2015-08-13 — End: 2015-08-13
  Administered 2015-08-13: 50 ug via INTRAVENOUS

## 2015-08-13 MED ORDER — ROCURONIUM BROMIDE 50 MG/5ML IV SOLN
INTRAVENOUS | Status: AC
  Filled 2015-08-13: qty 1

## 2015-08-13 MED ORDER — OXYCODONE-ACETAMINOPHEN 5-325 MG PO TABS: 1.0000 | ORAL_TABLET | ORAL | Status: DC | PRN

## 2015-08-13 MED ORDER — LACTATED RINGERS IV SOLN
INTRAVENOUS | Status: DC
  Administered 2015-08-13 (×3): via INTRAVENOUS

## 2015-08-13 MED ORDER — ROCURONIUM BROMIDE 100 MG/10ML IV SOLN
INTRAVENOUS | Status: DC | PRN
  Administered 2015-08-13: 30 mg via INTRAVENOUS

## 2015-08-13 MED ORDER — PROPOFOL 10 MG/ML IV BOLUS
INTRAVENOUS | Status: DC | PRN
  Administered 2015-08-13: 100 mg via INTRAVENOUS

## 2015-08-13 MED ORDER — BUPIVACAINE-EPINEPHRINE (PF) 0.25% -1:200000 IJ SOLN
INTRAMUSCULAR | Status: AC
  Filled 2015-08-13: qty 30

## 2015-08-13 MED ORDER — SODIUM CHLORIDE 0.9 % IR SOLN
Status: DC | PRN
  Administered 2015-08-13 (×2): 3000 mL

## 2015-08-13 MED ORDER — PHENYLEPHRINE HCL 10 MG/ML IJ SOLN
INTRAMUSCULAR | Status: DC | PRN
  Administered 2015-08-13: 80 ug via INTRAVENOUS

## 2015-08-13 MED ORDER — FENTANYL CITRATE (PF) 100 MCG/2ML IJ SOLN
INTRAMUSCULAR | Status: AC
  Administered 2015-08-13: 50 ug via INTRAVENOUS
  Filled 2015-08-13: qty 2

## 2015-08-13 MED ORDER — LIDOCAINE HCL (CARDIAC) 20 MG/ML IV SOLN
INTRAVENOUS | Status: DC | PRN
  Administered 2015-08-13: 100 mg via INTRAVENOUS

## 2015-08-13 MED ORDER — BUPIVACAINE-EPINEPHRINE 0.25% -1:200000 IJ SOLN
INTRAMUSCULAR | Status: DC | PRN
  Administered 2015-08-13: 5 mL

## 2015-08-13 MED ORDER — PROPOFOL 10 MG/ML IV BOLUS
INTRAVENOUS | Status: AC
  Filled 2015-08-13: qty 20

## 2015-08-13 MED ORDER — LIDOCAINE HCL (CARDIAC) 20 MG/ML IV SOLN
INTRAVENOUS | Status: AC
  Filled 2015-08-13: qty 5

## 2015-08-13 MED ORDER — MIDAZOLAM HCL 2 MG/2ML IJ SOLN
INTRAMUSCULAR | Status: AC
  Filled 2015-08-13: qty 4

## 2015-08-13 MED ORDER — NEOSTIGMINE METHYLSULFATE 10 MG/10ML IV SOLN
INTRAVENOUS | Status: DC | PRN
  Administered 2015-08-13: 3 mg via INTRAVENOUS

## 2015-08-13 MED ORDER — EPHEDRINE SULFATE 50 MG/ML IJ SOLN
INTRAMUSCULAR | Status: DC | PRN
  Administered 2015-08-13 (×4): 10 mg via INTRAVENOUS

## 2015-08-13 MED ORDER — PROMETHAZINE HCL 25 MG/ML IJ SOLN: 6.2500 mg | INTRAMUSCULAR | Status: DC | PRN

## 2015-08-13 MED ORDER — 0.9 % SODIUM CHLORIDE (POUR BTL) OPTIME
TOPICAL | Status: DC | PRN
  Administered 2015-08-13: 1000 mL

## 2015-08-13 MED ORDER — BUPIVACAINE HCL (PF) 0.5 % IJ SOLN
INTRAMUSCULAR | Status: DC | PRN
  Administered 2015-08-13: 20 mL via PERINEURAL

## 2015-08-13 MED ORDER — FENTANYL CITRATE (PF) 250 MCG/5ML IJ SOLN
INTRAMUSCULAR | Status: AC
  Filled 2015-08-13: qty 5

## 2015-08-13 MED ORDER — HYDROMORPHONE HCL 1 MG/ML IJ SOLN: 0.2500 mg | INTRAMUSCULAR | Status: DC | PRN

## 2015-08-13 MED ORDER — ONDANSETRON HCL 4 MG/2ML IJ SOLN
INTRAMUSCULAR | Status: DC | PRN
  Administered 2015-08-13: 4 mg via INTRAVENOUS

## 2015-08-13 MED ORDER — SUCCINYLCHOLINE CHLORIDE 20 MG/ML IJ SOLN
INTRAMUSCULAR | Status: AC
  Filled 2015-08-13: qty 1

## 2015-08-13 MED ORDER — ONDANSETRON HCL 4 MG/2ML IJ SOLN
INTRAMUSCULAR | Status: AC
  Filled 2015-08-13: qty 2

## 2015-08-13 MED ORDER — PHENYLEPHRINE HCL 10 MG/ML IJ SOLN
10.0000 mg | INTRAVENOUS | Status: DC | PRN
  Administered 2015-08-13: 20 ug/min via INTRAVENOUS

## 2015-08-13 MED ORDER — PHENYLEPHRINE 40 MCG/ML (10ML) SYRINGE FOR IV PUSH (FOR BLOOD PRESSURE SUPPORT)
PREFILLED_SYRINGE | INTRAVENOUS | Status: AC
  Filled 2015-08-13: qty 10

## 2015-08-13 SURGICAL SUPPLY — 70 items
ANCH SUT 2 1.3X1 LD 1 STRN (Anchor) ×1 IMPLANT
ANCH SUT 360D 2 2 LD 2 STRN (Anchor) ×1 IMPLANT
ANCHOR ALL- SUT RC 2 SUT Y-K (Anchor) ×2 IMPLANT
ANCHOR ALL-SUT FLEX 1.3 Y-KNOT (Anchor) ×2 IMPLANT
ANCHOR ALL-SUT RC 2 SUT Y-K (Anchor) IMPLANT
BIT DRILL 1.3M DISPOSABLE (BIT) ×2 IMPLANT
BLADE LONG MED 31MMX9MM (MISCELLANEOUS) ×1
BLADE LONG MED 31X9 (MISCELLANEOUS) ×2 IMPLANT
BLADE SURG 11 STRL SS (BLADE) ×3 IMPLANT
BLADE W/14.0X25.5MM (BLADE) ×3 IMPLANT
BUR OVAL 4.0 (BURR) ×2 IMPLANT
CLOSURE WOUND 1/2 X4 (GAUZE/BANDAGES/DRESSINGS) ×1
COVER SURGICAL LIGHT HANDLE (MISCELLANEOUS) ×3 IMPLANT
DRAPE INCISE IOBAN 66X45 STRL (DRAPES) ×3 IMPLANT
DRAPE STERI 35X30 U-POUCH (DRAPES) ×3 IMPLANT
DRAPE U-SHAPE 47X51 STRL (DRAPES) ×3 IMPLANT
DRSG EMULSION OIL 3X3 NADH (GAUZE/BANDAGES/DRESSINGS) ×4 IMPLANT
DRSG PAD ABDOMINAL 8X10 ST (GAUZE/BANDAGES/DRESSINGS) ×6 IMPLANT
DURAPREP 26ML APPLICATOR (WOUND CARE) ×3 IMPLANT
ELECT NEEDLE TIP 2.8 STRL (NEEDLE) ×3 IMPLANT
ELECT REM PT RETURN 9FT ADLT (ELECTROSURGICAL)
ELECTRODE REM PT RTRN 9FT ADLT (ELECTROSURGICAL) IMPLANT
GAUZE SPONGE 4X4 12PLY STRL (GAUZE/BANDAGES/DRESSINGS) ×3 IMPLANT
GLOVE BIOGEL PI ORTHO PRO 7.5 (GLOVE) ×2
GLOVE BIOGEL PI ORTHO PRO SZ8 (GLOVE) ×2
GLOVE ORTHO TXT STRL SZ7.5 (GLOVE) ×3 IMPLANT
GLOVE PI ORTHO PRO STRL 7.5 (GLOVE) ×1 IMPLANT
GLOVE PI ORTHO PRO STRL SZ8 (GLOVE) ×1 IMPLANT
GLOVE SURG ORTHO 8.5 STRL (GLOVE) ×3 IMPLANT
GOWN STRL REUS W/ TWL XL LVL3 (GOWN DISPOSABLE) ×4 IMPLANT
GOWN STRL REUS W/TWL XL LVL3 (GOWN DISPOSABLE) ×12
KIT BASIN OR (CUSTOM PROCEDURE TRAY) ×3 IMPLANT
KIT ROOM TURNOVER OR (KITS) ×3 IMPLANT
MANIFOLD NEPTUNE II (INSTRUMENTS) ×3 IMPLANT
NDL SPNL 18GX3.5 QUINCKE PK (NEEDLE) ×1 IMPLANT
NDL SUT .5 MAYO 1.404X.05X (NEEDLE) ×1 IMPLANT
NDL SUT 6 .5 CRC .975X.05 MAYO (NEEDLE) ×1 IMPLANT
NEEDLE HYPO 25GX1X1/2 BEV (NEEDLE) ×3 IMPLANT
NEEDLE MAYO TAPER (NEEDLE) ×6
NEEDLE SPNL 18GX3.5 QUINCKE PK (NEEDLE) ×3 IMPLANT
NS IRRIG 1000ML POUR BTL (IV SOLUTION) ×3 IMPLANT
PACK SHOULDER (CUSTOM PROCEDURE TRAY) ×3 IMPLANT
PAD ABD 8X10 STRL (GAUZE/BANDAGES/DRESSINGS) ×2 IMPLANT
PAD ARMBOARD 7.5X6 YLW CONV (MISCELLANEOUS) ×6 IMPLANT
RESECTOR FULL RADIUS 4.2MM (BLADE) ×3 IMPLANT
SET ARTHROSCOPY TUBING (MISCELLANEOUS) ×3
SET ARTHROSCOPY TUBING LN (MISCELLANEOUS) ×1 IMPLANT
SLING ARM LRG ADULT FOAM STRAP (SOFTGOODS) ×3 IMPLANT
SLING ARM MED ADULT FOAM STRAP (SOFTGOODS) IMPLANT
SPONGE GAUZE 4X4 12PLY STER LF (GAUZE/BANDAGES/DRESSINGS) ×2 IMPLANT
STRIP CLOSURE SKIN 1/2X4 (GAUZE/BANDAGES/DRESSINGS) ×2 IMPLANT
SUT BONE WAX W31G (SUTURE) ×3 IMPLANT
SUT FIBERWIRE #2 38 T-5 BLUE (SUTURE)
SUT HI-FI 2 STRAND C-2 40 (SUTURE) ×2 IMPLANT
SUT MNCRL AB 3-0 PS2 18 (SUTURE) ×3 IMPLANT
SUT VIC AB 0 CT1 27 (SUTURE) ×3
SUT VIC AB 0 CT1 27XBRD ANBCTR (SUTURE) ×1 IMPLANT
SUT VIC AB 0 CT2 27 (SUTURE) IMPLANT
SUT VIC AB 2-0 CT1 27 (SUTURE) ×3
SUT VIC AB 2-0 CT1 TAPERPNT 27 (SUTURE) ×1 IMPLANT
SUT VICRYL 0 CT 1 36IN (SUTURE) ×3 IMPLANT
SUTURE FIBERWR #2 38 T-5 BLUE (SUTURE) IMPLANT
SYR CONTROL 10ML LL (SYRINGE) ×3 IMPLANT
TAPE CLOTH SURG 6X10 WHT LF (GAUZE/BANDAGES/DRESSINGS) ×2 IMPLANT
TOWEL OR 17X24 6PK STRL BLUE (TOWEL DISPOSABLE) ×3 IMPLANT
TOWEL OR 17X26 10 PK STRL BLUE (TOWEL DISPOSABLE) ×3 IMPLANT
TUBE CONNECTING 12'X1/4 (SUCTIONS) ×1
TUBE CONNECTING 12X1/4 (SUCTIONS) ×2 IMPLANT
WAND HAND CNTRL MULTIVAC 90 (MISCELLANEOUS) ×3 IMPLANT
WATER STERILE IRR 1000ML POUR (IV SOLUTION) ×3 IMPLANT

## 2015-08-13 NOTE — Transfer of Care (Signed)
Immediate Anesthesia Transfer of Care Note  Patient: [name]  Procedure(s) Performed: Procedure(s): LEFT SHOULDER ARTHROSCOPY WITH SUBACROMIAL DECOMPRESSION AND MINI OPEN ROTATOR CUFF REPAIR, BICEPS TENODESIS AND OPEN DISTAL CLAVICLE RESECTION (Left)  Patient Location: PACU  Anesthesia Type:GA combined with regional for post-op pain  Level of Consciousness: awake  Airway & Oxygen Therapy: Patient Spontanous Breathing and Patient connected to nasal cannula oxygen  Post-op Assessment: Report given to RN and Post -op Vital signs reviewed and stable  Post vital signs: stable  Last Vitals:  Filed Vitals:   08/13/15 0925  BP: 123/62  Pulse: 55  Temp:   Resp: 15    Complications: No apparent anesthesia complications

## 2015-08-13 NOTE — Anesthesia Postprocedure Evaluation (Signed)
  Anesthesia Post-op Note  Patient: [name]  Procedure(s) Performed: Procedure(s) (LRB): LEFT SHOULDER ARTHROSCOPY WITH SUBACROMIAL DECOMPRESSION AND MINI OPEN ROTATOR CUFF REPAIR, BICEPS TENODESIS AND OPEN DISTAL CLAVICLE RESECTION (Left)  Patient Location: PACU  Anesthesia Type: GA combined with regional for post-op pain  Level of Consciousness: awake and alert   Airway and Oxygen Therapy: Patient Spontanous Breathing  Post-op Pain: mild  Post-op Assessment: Post-op Vital signs reviewed, Patient's Cardiovascular Status Stable, Respiratory Function Stable, Patent Airway and No signs of Nausea or vomiting  Last Vitals:  Filed Vitals:   08/13/15 1300  BP: 129/78  Pulse: 61  Temp:   Resp: 14    Post-op Vital Signs: stable   Complications: No apparent anesthesia complications

## 2015-08-13 NOTE — Progress Notes (Signed)
Orthopedic Tech Progress Note Patient Details:  [name] 1946-06-07 [id number]  Ortho Devices Type of Ortho Device: Abduction pillow, Shoulder abduction pillow Ortho Device/Splint Interventions: Application   [place] Pink 08/13/2015, 11:58 AM

## 2015-08-13 NOTE — Discharge Instructions (Signed)
Ice to the shoulder as much as possible.  Do exercises every hour while awake.  Lap slides or pillow slides, Pendulums, Internal and External Rotation exercises - hug yourself then hitchhike thumb out away from your body.  Keep the incision clean and dry for five days, then ok to shower and get the wounds wet.  Change bandage to Band Aids on Monday. (3 days)  Wear the pillow sling out of the house, in the home ok to lean to the left side, slide the sling off and replace with a pillow under the arm - hug a pillow.  Follow up in two weeks in the office  [phone]

## 2015-08-13 NOTE — Anesthesia Procedure Notes (Addendum)
Anesthesia Regional Block:  Interscalene brachial plexus block  Pre-Anesthetic Checklist: ,, timeout performed, Correct Patient, Correct Site, Correct Laterality, Correct Procedure, Correct Position, site marked, Risks and benefits discussed,  Surgical consent,  Pre-op evaluation,  At surgeon's request and post-op pain management  Laterality: Left  Prep: chloraprep       Needles:  Injection technique: Single-shot  Needle Type: Echogenic Needle     Needle Length: 9cm 9 cm Needle Gauge: 21 and 21 G    Additional Needles:  Procedures: ultrasound guided (picture in chart) Interscalene brachial plexus block Narrative:  Injection made incrementally with aspirations every 5 mL.  Performed by: Personally  Anesthesiologist: ROSE, Iona Beard  Additional Notes: Patient tolerated the procedure well without complications   Procedure Name: Intubation Date/Time: 08/13/2015 10:33 AM Performed by: Rejeana Brock L Pre-anesthesia Checklist: Patient identified, Emergency Drugs available, Suction available, Patient being monitored and Timeout performed Patient Re-evaluated:Patient Re-evaluated prior to inductionOxygen Delivery Method: Circle system utilized Preoxygenation: Pre-oxygenation with 100% oxygen Intubation Type: IV induction Ventilation: Mask ventilation without difficulty Laryngoscope Size: Mac and 4 Grade View: Grade I Tube type: Oral Tube size: 7.5 mm Number of attempts: 1 Airway Equipment and Method: Stylet Placement Confirmation: ETT inserted through vocal cords under direct vision,  positive ETCO2 and breath sounds checked- equal and bilateral Secured at: 22 cm Tube secured with: Tape Dental Injury: Teeth and Oropharynx as per pre-operative assessment

## 2015-08-13 NOTE — Brief Op Note (Signed)
08/13/2015  12:44 PM  PATIENT:  [name]  69 y.o. male  PRE-OPERATIVE DIAGNOSIS:  left shoulder rotator cuff, SLAP, Osteoarthritis  POST-OPERATIVE DIAGNOSIS:  left shoulder rotator cuff tear, SLAP, OA  PROCEDURE:  Procedure(s): LEFT SHOULDER ARTHROSCOPY WITH SUBACROMIAL DECOMPRESSION AND MINI OPEN ROTATOR CUFF REPAIR, BICEPS TENODESIS AND OPEN DISTAL CLAVICLE RESECTION (Left)  SURGEON:  Surgeon(s) and Role:    * Netta Cedars, MD - Primary  PHYSICIAN ASSISTANT:   ASSISTANTS: Ventura Bruns, PA-C   ANESTHESIA:   regional and general  EBL:  Total I/O In: 1100 [I.V.:1100] Out: 100 [Blood:100]  BLOOD ADMINISTERED:none  DRAINS: none   LOCAL MEDICATIONS USED:  MARCAINE     SPECIMEN:  No Specimen  DISPOSITION OF SPECIMEN:  N/A  COUNTS:  YES  TOURNIQUET:  * No tourniquets in log *  DICTATION: .Other Dictation: Dictation Number [id number]  PLAN OF CARE: Discharge to home after PACU  PATIENT DISPOSITION:  PACU - hemodynamically stable.   Delay start of Pharmacological VTE agent (>24hrs) due to surgical blood loss or risk of bleeding: no

## 2015-08-13 NOTE — Interval H&P Note (Signed)
History and Physical Interval Note:  08/13/2015 7:23 AM  [name]  has presented today for surgery, with the diagnosis of left shoulder rotator cuff, SLAP, Osteoarthritis  The various methods of treatment have been discussed with the patient and family. After consideration of risks, benefits and other options for treatment, the patient has consented to  Procedure(s): LEFT SHOULDER ARTHROSCOPY WITH SUBACROMIAL DECOMPRESSION AND MINI OPEN ROTATOR CUFF REPAIR, BICEPS TENODESIS AND OPEN DISTAL CLAVICLE RESECTION (Left) as a surgical intervention .  The patient's history has been reviewed, patient examined, no change in status, stable for surgery.  I have reviewed the patient's chart and labs.  Questions were answered to the patient's satisfaction.     Irvin Bastin,STEVEN R

## 2015-08-13 NOTE — Interval H&P Note (Signed)
History and Physical Interval Note:  08/13/2015 9:43 AM  [name]  has presented today for surgery, with the diagnosis of left shoulder rotator cuff, SLAP, Osteoarthritis  The various methods of treatment have been discussed with the patient and family. After consideration of risks, benefits and other options for treatment, the patient has consented to  Procedure(s): LEFT SHOULDER ARTHROSCOPY WITH SUBACROMIAL DECOMPRESSION AND MINI OPEN ROTATOR CUFF REPAIR, BICEPS TENODESIS AND OPEN DISTAL CLAVICLE RESECTION (Left) as a surgical intervention .  The patient's history has been reviewed, patient examined, no change in status, stable for surgery.  I have reviewed the patient's chart and labs.  Questions were answered to the patient's satisfaction.     Keona Bilyeu,STEVEN R

## 2015-08-13 NOTE — Anesthesia Preprocedure Evaluation (Addendum)
Anesthesia Evaluation  Patient identified by MRN, date of birth, ID band Patient awake    Reviewed: Allergy & Precautions, NPO status , Patient's Chart, lab work & pertinent test results  Airway Mallampati: II  TM Distance: >3 FB Neck ROM: Full    Dental no notable dental hx. (+) Dental Advisory Given   Pulmonary neg pulmonary ROS,    Pulmonary exam normal breath sounds clear to auscultation       Cardiovascular hypertension, Pt. on medications Normal cardiovascular exam Rhythm:Regular Rate:Normal     Neuro/Psych negative neurological ROS  negative psych ROS   GI/Hepatic negative GI ROS, Neg liver ROS,   Endo/Other  negative endocrine ROS  Renal/GU negative Renal ROS  negative genitourinary   Musculoskeletal negative musculoskeletal ROS (+)   Abdominal (+)  Abdomen: soft. Bowel sounds: normal.  Peds negative pediatric ROS (+)  Hematology negative hematology ROS (+)   Anesthesia Other Findings   Reproductive/Obstetrics negative OB ROS                          Anesthesia Physical Anesthesia Plan  ASA: II  Anesthesia Plan: General   Post-op Pain Management: GA combined w/ Regional for post-op pain   Induction: Intravenous  Airway Management Planned: Oral ETT  Additional Equipment:   Intra-op Plan:   Post-operative Plan: Extubation in OR  Informed Consent: I have reviewed the patients History and Physical, chart, labs and discussed the procedure including the risks, benefits and alternatives for the proposed anesthesia with the patient or authorized representative who has indicated his/her understanding and acceptance.   Dental advisory given  Plan Discussed with: CRNA and Surgeon  Anesthesia Plan Comments:         Anesthesia Quick Evaluation

## 2015-08-14 NOTE — Op Note (Signed)
NAME:  [name], [name] NO.:  [id number]  MEDICAL RECORD NO.:  [id number]  LOCATION:  [facility]                         FACILITY:  [facility]  PHYSICIAN:  [name], M.D. DATE OF BIRTH:  Jun 12, 1946  DATE OF PROCEDURE:  08/13/2015 DATE OF DISCHARGE:  08/13/2015                              OPERATIVE REPORT   PREOPERATIVE DIAGNOSES:  Left shoulder rotator cuff tear, superior labrum anterior and posterior tear, acromioclavicular joint arthritis.  POSTOPERATIVE DIAGNOSES:  Left shoulder rotator cuff tear, superior labrum anterior and posterior tear, acromioclavicular joint arthritis, and labral tear.  PROCEDURE PERFORMED:  Left shoulder arthroscopy with extensive intra- articular debridement of torn superior labrum anterior to posterior, arthroscopic biceps tenotomy, arthroscopic debridement and pan labral tear,  arthroscopic subacromial decompression followed by mini-open rotator cuff repair and biceps tenodesis in the groove and open distal clavicle resection.  ATTENDING SURGEON:  Anthony Heater. Veverly Fells, MD.  ASSISTANT:  Anthony Pao. Dixon, PA-C, who scrubbed the entire procedure and necessary for satisfactory completion of surgery.  ANESTHESIA:  General anesthesia was used plus interscalene block.  ESTIMATED BLOOD LOSS:  Minimal.  FLUID REPLACEMENT:  1200 mL of crystalloid.  INSTRUMENT COUNTS:  Correct.  COMPLICATIONS:  There were no complications.  ANTIBIOTICS:  Perioperative antibiotics were given.  INDICATIONS:  The patient is a 69 year old male with worsening left shoulder pain secondary to MRI documented rotator cuff tear, SLAP tear, and advanced AC arthritis without impingement.  The patient has failed all measures of conservative management and desires operative treatment to restore function and eliminate pain in the shoulder.  Informed consent obtained.  DESCRIPTION OF PROCEDURE:  After an adequate level of anesthesia achieved, the patient was  positioned in modified beach-chair position. Left shoulder correctly identified and sterilely prepped and draped in usual manner.  Time-out was called.  We examined the patient's shoulder under anesthesia and no undue stiffness or instability was noted.  We then proceeded with the introduction of an arthroscopic cannula into the shoulder joint.  We used standard portals including anterior, posterior, and lateral portals.  We identified significant synovitis in the shoulder joint.  Extensive tearing was noted in the superior labral biceps junction creating unstable biceps anchor.  We performed a biceps tenotomy and labrum debrided back to stable labral rim.  We noted there was significant tearing in the anterior- inferior and posterior-inferior labrum, essentially a pan labral tear.  Minimal chondromalacia was noted in the glenohumeral joint, and we performed gentle tangential chondroplasty for some rough cartilage on the glenoid.  After we completed complete debridement of the glenoid labrum circumferentially to remove all unstable labral tissue, we used basket forceps and motorized shaver, subscapularis rolled edge, intact rotator cuff and undersurface, partial-thickness tear of the supraspinatus tendon. Infraspinatus teres minor appeared normal as visualized from the anterior portal looking posteriorly.  We then placed a scope in the subacromial space.  Thorough bursectomy and acromioplasty was performed creating a type 1 acromial shape with a butcher block technique utilizing a high-speed bur.  Following our decompression and CA ligament release, we noted there to be a significant near full-thickness rotator cuff tear on the bursal surface.  Once we completed our subacromial decompression,  identified the rotator cuff tear.  We concluded the arthroscopic portion of the procedure.  We then made a small Saber incision overlying the AC joint.  Dissection down through subcutaneous tissues  using a needle tip Bovie identified deltotrapezial fascia and divided that in line with its clavicle.  Subperiosteal dissection of the distal clavicle performed, followed by excision of distal 2-3 mm of bone using an oscillating saw.  We thoroughly irrigated the AC interval.  I checked to make sure the anterior, posterior, and AC ligaments were intact which they were.  We applied bone wax to the cut in the clavicle. We removed the excess wax.  We then repaired the deltotrapezial fascia with interrupted 0 Vicryl suture figure-of-eight, followed by 2-0 Vicryl subcutaneous closure, and 4-0 Monocryl for the skin.  We then addressed the rotator cuff and the biceps through a single mini open incision started at the anterolateral border of the acromion and extending distally about 3-4 cm in the raphe between the anterior and lateral heads of the deltoid.  Dissection down through subcutaneous tissues after skin incision using needle-tip Bovie, I identified the deltoid raphe and divided that, placed Arthrex retractor, identified the biceps groove.  We removed some excess bursa over the top of the bicipital groove.  I then incised the soft tissue covering the groove.  We delivered the biceps tendon out of the wound, prepared before the biceps groove with the needle-tip Bovie and a curette.  We then placed a Y-Knot Flex anchor to the floor of the biceps groove and brought that suture up in a whipstitch fashion through the biceps tendon suturing that tendon mid tension with the elbow at 90 degrees down to the bone.  We then over sewed with 0 Vicryl suture figure-of-eight x2 gaining some purchase in the pec tendon distally as well.  We reinforced our repair.  We felt good about the repair.  The patient's bone was extremely soft as we drilled that anchor.  We then addressed the rotator cuff tear.  This was an attritional dorsal surface tear, we completed with a 15 blade scalpel, mobilized the tendon  with a cuff grasper and then sutures in the free edge of the tendon and then used a Cobb elevator to mobilize the tendon and made sure we had reduced anatomically which we did.  We prepared the greater tuberosity with a rongeur and curette.  We then went ahead and placed a Y-Knot RC anchor at the articular margin of the rotator cuff footprint and then brought that suture up through the rotator cuff tendon to the appropriate level which was going to recreate the medial portion of the footprint.  We used both #2 HiFi sutures in a mattress fashion medially, then we took 3 full-thickness lateral sutures which were free edge sutures and brought them down through holes in the bone and trans osseous tunnels.  We tied those over the lateral humerus and the greater tuberosity and had nice low-profile repair.  We then over sewed the biceps tenodesis suture ends and we brought those up and tied into the medial most anchor to give further compression to that footprint.  So we had a nice anatomic repair not under tension, could range his shoulder fully, no impingement.  We thoroughly irrigated the subdeltoid interval and then repaired the deltoid anatomically with 0 Vicryl suture, followed by 2-0 Vicryl subcutaneous closure, 4-0 Monocryl for skin and portals.  Steri-Strips applied followed by sterile dressing.  The patient tolerated the surgery well.  [name], M.D.     SRN/MEDQ  D:  08/13/2015  T:  08/14/2015  Job:  [id number]

## 2015-08-16 ENCOUNTER — Encounter (HOSPITAL_COMMUNITY): Payer: Self-pay | Admitting: Orthopedic Surgery

## 2018-01-18 HISTORY — PX: CORONARY ARTERY BYPASS GRAFT: SHX141

## 2018-02-11 ENCOUNTER — Telehealth (HOSPITAL_COMMUNITY): Payer: Self-pay | Admitting: *Deleted

## 2018-02-11 NOTE — Telephone Encounter (Signed)
Received referral from Dr. Clementeen Graham at [facility] and spoke to patient. Advised pt that we are awaiting additional information from Dr. Clementeen Graham office.  Asked pt about his insurance coverage.  Pt has [facility].  Asked if pt has any upcoming appointments at the [facility].  Pt stated late June. Advised pt that he will need to initiate authorization to participate in cardiac rehab.  Pt said that [facility] has the authorization. Clarified with pt whether the authorization was for the surgery specifically.  Pt said that since he was referred to [facility] the authorization would cover this. Told pt that we had not received any authorization so I will call them and have them send the authorization to [place] for review of coverage for cardiac rehab.  Informed pt that we are scheduling for the first of July for new patients.  Pt then stated curtly that he should have started his CR 2 weeks after his surgery and by July he would not need CR.  Pt then hung up the phone up on me. Cherre Huger, BSN Cardiac and Training and development officer

## 2018-02-12 ENCOUNTER — Telehealth: Payer: Self-pay | Admitting: *Deleted

## 2018-02-12 NOTE — Telephone Encounter (Signed)
Spoke with Mr Larkey today about Cardiac rehab in [place].  It would be a 35-45 minute drive for him to attend here.  I suggested he try [facility] which he said was closer to his home.   He will contact his PMD at the [facility] in [place] for referral and authorization if [place] works for him.   He does not want to drive to [facility] for this program.

## 2019-10-01 ENCOUNTER — Other Ambulatory Visit: Payer: Non-veteran care

## 2019-10-01 ENCOUNTER — Ambulatory Visit: Payer: Non-veteran care | Attending: Internal Medicine

## 2019-10-01 DIAGNOSIS — Z20822 Contact with and (suspected) exposure to covid-19: Secondary | ICD-10-CM

## 2019-10-02 LAB — NOVEL CORONAVIRUS, NAA: SARS-CoV-2, NAA: NOT DETECTED

## 2020-01-06 ENCOUNTER — Encounter: Payer: Self-pay | Admitting: Neurology

## 2020-03-04 NOTE — Progress Notes (Signed)
NEUROLOGY CONSULTATION NOTE  [name] MRN: [id number] DOB: August 30, 1946  Referring provider: Danie Binder, DPM Primary care provider: [facility]  Reason for consult:  neuropathy  HISTORY OF PRESENT ILLNESS: [name] is a 74 year old right-handed Caucasian male with HTN and history of colorectal cancer who presents for peripheral neuropathy.  History supplemented by referring provider's note.  He has had numbness in the feet for the past 2 years.  It feels tingling during the day but at night, it is burning when laying in bed.  It has progressed first in the toes and then to the entire bottom of the feet and more recently up to the ankles.  Legs feel weak.  He can't bend down to pick up the ball when playing golf.  He is unsteady on his feet and has fallen.  He has trouble walking up even the slightest incline.  No back or radicular pain.  No symptoms in the hands.    He tried B6 which was ineffective.   No history of diabetes.   He does not drink alcohol. He was exposed to [place] in [place].  Otherwise, no known exposure to chemicals or heavy metals.   No known family history of neuropathy.    PAST MEDICAL HISTORY: Past Medical History:  Diagnosis Date  . 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 Hypertension     PAST SURGICAL HISTORY: Past Surgical History:  Procedure Laterality Date  . HIP ARTHROPLASTY     BILAT  . NECK MASS EXCISION     RT SIDE LYMPH NODE REMOVED  . SHOULDER ARTHROSCOPY WITH SUBACROMIAL DECOMPRESSION AND OPEN ROTATOR C Left 08/13/2015   Procedure: LEFT SHOULDER ARTHROSCOPY WITH SUBACROMIAL DECOMPRESSION AND MINI OPEN ROTATOR CUFF REPAIR, BICEPS TENODESIS AND OPEN DISTAL CLAVICLE RESECTION;  Surgeon: Netta Cedars, MD;  Location: [facility];  Service: Orthopedics;  Laterality: Left;  . TONSILLECTOMY    . WRIST SURGERY     LEFT    MEDICATIONS: Current Outpatient Medications on File Prior to Visit  Medication Sig Dispense Refill   . lisinopril (PRINIVIL,ZESTRIL) 10 MG tablet Take 10 mg by mouth daily.    . methocarbamol (ROBAXIN) 500 MG tablet Take 1 tablet (500 mg total) by mouth 3 (three) times daily as needed. 60 tablet 1  . oxyCODONE-acetaminophen (ROXICET) 5-325 MG tablet Take 1-2 tablets by mouth every 4 (four) hours as needed for severe pain. 60 tablet 0  . pravastatin (PRAVACHOL) 20 MG tablet Take 20 mg by mouth daily.     No current facility-administered medications on file prior to visit.    ALLERGIES: No Known Allergies  FAMILY HISTORY: No family history on file.  SOCIAL HISTORY: Social History   Socioeconomic History  . Marital status: Single    Spouse name: Not on file  . Number of children: Not on file  . Years of education: Not on file  . Highest education level: Not on file  Occupational History  . Not on file  Tobacco Use  . Smoking status: Never Smoker  Substance and Sexual Activity  . Alcohol use: No  . Drug use: No  . Sexual activity: Not on file  Other Topics Concern  . Not on file  Social History Narrative  . Not on file   Social Determinants of Health   Financial Resource Strain:   . Difficulty of Paying Living Expenses:   Food Insecurity:   . Worried About Charity fundraiser in  the Last Year:   . [place] in the Last Year:   Transportation Needs:   . Film/video editor (Medical):   Marland Kitchen Lack of Transportation (Non-Medical):   Physical Activity:   . Days of Exercise per Week:   . Minutes of Exercise per Session:   Stress:   . Feeling of Stress :   Social Connections:   . Frequency of Communication with Friends and Family:   . Frequency of Social Gatherings with Friends and Family:   . Attends Religious Services:   . Active Member of Clubs or Organizations:   . Attends Archivist Meetings:   Marland Kitchen Marital Status:   Intimate Partner Violence:   . Fear of Current or Ex-Partner:   . Emotionally Abused:   Marland Kitchen Physically Abused:   . Sexually Abused:      REVIEW OF SYSTEMS: Constitutional: No fevers, chills, or sweats, no generalized fatigue, change in appetite Eyes: No visual changes, double vision, eye pain Ear, nose and throat: No hearing loss, ear pain, nasal congestion, sore throat Cardiovascular: No chest pain, palpitations Respiratory:  No shortness of breath at rest or with exertion, wheezes GastrointestinaI: No nausea, vomiting, diarrhea, abdominal pain, fecal incontinence Genitourinary:  No dysuria, urinary retention or frequency Musculoskeletal:  No neck pain, back pain Integumentary: No rash, pruritus, skin lesions Neurological: as above Psychiatric: No depression, insomnia, anxiety Endocrine: No palpitations, fatigue, diaphoresis, mood swings, change in appetite, change in weight, increased thirst Hematologic/Lymphatic:  No purpura, petechiae. Allergic/Immunologic: no itchy/runny eyes, nasal congestion, recent allergic reactions, rashes  PHYSICAL EXAM: Blood pressure (!) 147/80, pulse (!) 52, height 6' (1.829 m), weight 166 lb 12.8 oz (75.7 kg), SpO2 99 %. General: No acute distress.  Patient appears well-groomed.  Head:  Normocephalic/atraumatic Eyes:  fundi examined but not visualized Neck: supple, no paraspinal tenderness, full range of motion Back: No paraspinal tenderness Heart: regular rate and rhythm Lungs: Clear to auscultation bilaterally. Vascular: No carotid bruits. Neurological Exam: Mental status: alert and oriented to person, place, and time, recent and remote memory intact, fund of knowledge intact, attention and concentration intact, speech fluent and not dysarthric, language intact. Cranial nerves: CN I: not tested CN II: pupils equal, round and reactive to light, visual fields intact CN III, IV, VI:  full range of motion, no nystagmus, no ptosis CN V: facial sensation intact CN VII: upper and lower face symmetric CN VIII: hearing intact CN IX, X: gag intact, uvula midline CN XI:  sternocleidomastoid and trapezius muscles intact CN XII: tongue midline Bulk & Tone: normal, no fasciculations. Motor:  5/5 throughout  Sensation:  Pinprick sensation reduced up to mid dorsum of feet and vibration sensation reduced in toes. Deep Tendon Reflexes:  2+ throughout except absent in ankles; toes downgoing.  Finger to nose testing:  Without dysmetria.  Heel to shin:  Without dysmetria.  Gait:  Wide-based unsteady.  Able to turn but not tandem walk. Romberg positive.  IMPRESSION: Polyneuropathy  PLAN: 1.  Start gabapentin 300mg  at bedtime.  We can increase dose in 4 weeks if needed. 2.  Check NCV-EMG of lower extremities 3.  Check ANA, sed rate, CRP, B12, TSH, B6, SPEP/IFE, ACE 4.  Follow up in 4 months.  Thank you for allowing me to take part in the care of this patient.  Metta Clines, DO

## 2020-03-08 ENCOUNTER — Other Ambulatory Visit: Payer: Self-pay

## 2020-03-08 ENCOUNTER — Telehealth: Payer: Self-pay | Admitting: Neurology

## 2020-03-08 ENCOUNTER — Encounter: Payer: Self-pay | Admitting: Neurology

## 2020-03-08 ENCOUNTER — Other Ambulatory Visit (INDEPENDENT_AMBULATORY_CARE_PROVIDER_SITE_OTHER): Payer: No Typology Code available for payment source

## 2020-03-08 ENCOUNTER — Ambulatory Visit (INDEPENDENT_AMBULATORY_CARE_PROVIDER_SITE_OTHER): Payer: No Typology Code available for payment source | Admitting: Neurology

## 2020-03-08 VITALS — BP 147/80 | HR 52 | Ht 72.0 in | Wt 166.8 lb

## 2020-03-08 DIAGNOSIS — G6289 Other specified polyneuropathies: Secondary | ICD-10-CM

## 2020-03-08 LAB — TSH: TSH: 3.31 u[IU]/mL (ref 0.35–4.50)

## 2020-03-08 LAB — SEDIMENTATION RATE: Sed Rate: 25 mm/hr — ABNORMAL HIGH (ref 0–20)

## 2020-03-08 LAB — C-REACTIVE PROTEIN: CRP: <1 mg/dL (ref 0.5–20.0)

## 2020-03-08 LAB — VITAMIN B12: Vitamin B-12: 228 pg/mL (ref 211–911)

## 2020-03-08 MED ORDER — GABAPENTIN 100 MG PO CAPS: 300.0000 mg | ORAL_CAPSULE | Freq: Every day | ORAL | 0 refills | Status: DC

## 2020-03-08 NOTE — Telephone Encounter (Signed)
OK to send to [facility]

## 2020-03-08 NOTE — Patient Instructions (Addendum)
1.  Start gabapentin, 3 capsules at bedtime.  If pain not improved in 4 weeks, contact me and I can increase dose. 2.  Check ANA, sed rate, CRP, B12, TSH, B6, SPEP/IFE, ACE. Your provider has requested that you have labwork completed today. Please go to [facility] ([id number]) on the second floor of this building before leaving the office today. You do not need to check in. If you are not called within 15 minutes please check with the front desk.  3.  Will check nerve study of bilateral lower extremities. 4.  Follow up in 4 months.

## 2020-03-08 NOTE — Telephone Encounter (Signed)
Patient called and requested his medications that were filled today be sent to the [facility].

## 2020-03-08 NOTE — Telephone Encounter (Signed)
Script sent to the [place]. Script cancelled at [facility].

## 2020-03-10 ENCOUNTER — Telehealth: Payer: Self-pay | Admitting: Neurology

## 2020-03-10 NOTE — Telephone Encounter (Signed)
LMOVM Copy of lab resuls printed and at front desk

## 2020-03-10 NOTE — Telephone Encounter (Signed)
Patient returning call, he said he couldn't really hear the message.

## 2020-03-10 NOTE — Telephone Encounter (Signed)
Patient is requesting copies of his lab results.

## 2020-03-11 LAB — ANGIOTENSIN CONVERTING ENZYME: Angiotensin-Converting Enzyme: <5 U/L — ABNORMAL LOW (ref 9–67)

## 2020-03-11 LAB — VITAMIN B6: Vitamin B6: 4.4 ng/mL (ref 2.1–21.7)

## 2020-03-12 LAB — MULTIPLE MYELOMA PANEL, SERUM
Albumin SerPl Elph-Mcnc: 3.4 g/dL (ref 2.9–4.4)
Albumin/Glob SerPl: 1.2 (ref 0.7–1.7)
Alpha 1: 0.3 g/dL (ref 0.0–0.4)
Alpha2 Glob SerPl Elph-Mcnc: 0.8 g/dL (ref 0.4–1.0)
B-Globulin SerPl Elph-Mcnc: 0.8 g/dL (ref 0.7–1.3)
Gamma Glob SerPl Elph-Mcnc: 1.1 g/dL (ref 0.4–1.8)
Globulin, Total: 2.9 g/dL (ref 2.2–3.9)
IgA/Immunoglobulin A, Serum: 167 mg/dL (ref 61–437)
IgG (Immunoglobin G), Serum: 1002 mg/dL (ref 603–1613)
IgM (Immunoglobulin M), Srm: 99 mg/dL (ref 15–143)
Total Protein: 6.3 g/dL (ref 6.0–8.5)

## 2020-03-12 LAB — ENA+DNA/DS+SJORGEN'S
ENA RNP Ab: 1.4 AI — ABNORMAL HIGH (ref 0.0–0.9)
ENA SM Ab Ser-aCnc: <0.2 AI (ref 0.0–0.9)
ENA SSA (RO) Ab: 4.3 AI — ABNORMAL HIGH (ref 0.0–0.9)
ENA SSB (LA) Ab: <0.2 AI (ref 0.0–0.9)
dsDNA Ab: <1 IU/mL (ref 0–9)

## 2020-03-12 LAB — ANA W/REFLEX: Anti Nuclear Antibody (ANA): POSITIVE — AB

## 2020-05-19 ENCOUNTER — Other Ambulatory Visit: Payer: Self-pay

## 2020-05-19 ENCOUNTER — Ambulatory Visit (INDEPENDENT_AMBULATORY_CARE_PROVIDER_SITE_OTHER): Payer: No Typology Code available for payment source | Admitting: Neurology

## 2020-05-19 DIAGNOSIS — M5417 Radiculopathy, lumbosacral region: Secondary | ICD-10-CM

## 2020-05-19 DIAGNOSIS — G6289 Other specified polyneuropathies: Secondary | ICD-10-CM

## 2020-05-19 NOTE — Procedures (Signed)
[facility]  [address], [address]  [place], [place] [address] Tel: [phone] Fax:  [phone] Test Date:  05/19/2020  Patient: [name] DOB: 04/26/1946 Physician: Narda Amber, DO  Sex: Male Height: 6' " Ref Phys: Metta Clines, D.O.  ID#: [id number] Temp: 33.0C Technician:    Patient Complaints: This is a 74 year old man referred for evaluation of bilateral feet numbness, worse on the right.   NCV & EMG Findings: Extensive electrodiagnostic testing of the right lower extremity and additional studies of the left shows:  1. Bilateral sural and superficial peroneal sensory responses are within normal limits. 2. Bilateral peroneal and tibial motor responses are within normal limits. 3. Bilateral tibial H reflex studies are within normal limits. 4. Chronic motor axonal loss changes are seen affecting the L5-S1 myotomes on the right and L2-4 myotomes on the left. There is no evidence of accompanying active denervation.  Impression: 1. 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 2. 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 3. There is no evidence of a large fiber sensorimotor polyneuropathy affecting either lower extremity.   ___________________________ Narda Amber, DO    Nerve Conduction Studies Anti Sensory Summary Table   Stim Site NR Peak (ms) Norm Peak (ms) P-T Amp (V) Norm P-T Amp  Left Sup Peroneal Anti Sensory (Ant Lat Mall)  33C  12 cm    3.6 <4.6 5.6 >3  Right Sup Peroneal Anti Sensory (Ant Lat Mall)  33C  12 cm    2.8 <4.6 5.9 >3  Left Sural Anti Sensory (Lat Mall)  33C  Calf    4.4 <4.6 5.8 >3  Right Sural Anti Sensory (Lat Mall)  33C  Calf    3.0 <4.6 3.9 >3   Motor Summary Table   Stim Site NR Onset (ms) Norm Onset (ms) O-P Amp (mV) Norm O-P Amp Site1 Site2 Delta-0 (ms) Dist (cm) Vel (m/s) Norm Vel (m/s)  Left Peroneal Motor (Ext Dig Brev)  33C  Ankle    4.5 <6.0  2.5 >2.5 B Fib Ankle 8.3 37.0 45 >40  B Fib    12.8  1.9  Poplt B Fib 1.7 9.0 53 >40  Poplt    14.5  1.6         Right Peroneal Motor (Ext Dig Brev)  33C  Ankle    3.4 <6.0 2.6 >2.5 B Fib Ankle 8.9 38.0 43 >40  B Fib    12.3  1.8  Poplt B Fib 1.8 9.0 50 >40  Poplt    14.1  1.5         Left Tibial Motor (Abd Hall Brev)  33C  Ankle    4.4 <6.0 6.3 >4 Knee Ankle 10.0 41.0 41 >40  Knee    14.4  5.1         Right Tibial Motor (Abd Hall Brev)  33C  Ankle    4.3 <6.0 6.6 >4 Knee Ankle 10.5 42.0 40 >40  Knee    14.8  4.2          H Reflex Studies   NR H-Lat (ms) Lat Norm (ms) L-R H-Lat (ms)  Left Tibial (Gastroc)  33C     30.01 <35 0.06  Right Tibial (Gastroc)  33C     30.07 <35 0.06   EMG   Side Muscle Ins Act Fibs Psw Fasc Number Recrt Dur Dur. Amp Amp. Poly Poly. Comment  Left AntTibialis Nml Nml Nml Nml  Nml Nml Nml Nml Nml Nml Nml Nml N/A  Left Gastroc Nml Nml Nml Nml Nml Nml Nml Nml Nml Nml Nml Nml N/A  Left Flex Dig Long Nml Nml Nml Nml Nml Nml Nml Nml Nml Nml Nml Nml N/A  Left RectFemoris Nml Nml Nml Nml 1- Rapid Some 1+ Some 1+ Some 1+ N/A  Left GluteusMed Nml Nml Nml Nml Nml Nml Nml Nml Nml Nml Nml Nml N/A  Left AdductorLong Nml Nml Nml Nml 1- Rapid Some 1+ Some 1+ Some 1+ N/A  Right AntTibialis Nml Nml Nml Nml 1- Rapid Some 1+ Some 1+ Some 1+ N/A  Right Gastroc Nml Nml Nml Nml 1- Rapid Some 1+ Some 1+ Some 1+ N/A  Right Flex Dig Long Nml Nml Nml Nml 2- Rapid Many 1+ Many 1+ Many 1+ N/A  Right RectFemoris Nml Nml Nml Nml Nml Nml Nml Nml Nml Nml Nml Nml N/A  Right GluteusMed Nml Nml Nml Nml Nml Nml Nml Nml Nml Nml Nml Nml N/A  Right BicepsFemS Nml Nml Nml Nml 1- Rapid Few 1+ Few 1+ Nml Nml N/A      Waveforms:

## 2020-07-22 NOTE — Progress Notes (Signed)
NEUROLOGY FOLLOW UP OFFICE NOTE  [name] [id number]  HISTORY OF PRESENT ILLNESS: [name] is a 74 year old right-handed Caucasian male with HTN and history of colorectal cancer who follows up for polyneuropathy.  UPDATE:  He underwent a neuropathy workup.  NCV-EMG of lower extremities demonstrated right chronic mild to moderate L5-S1 radiculopathy and left chronic mild L2-4 radiculopathy but no large fiber polyneuropathy.  Blood work demonstrated negative CRP, showed a positive ANA with elevated SSA antibody of 4.3 and negative SSB antibody.  He was referred to rheumatology who did not think he had Sjogren's disease but will be following up.    He was prescribed gabapentin for neuropathic pain but it was ineffective.  He actually denies pain, just numbness.    HISTORY: He has had numbness in the feet since around 2019.  It feels tingling during the day but at night, it is burning when laying in bed.  It has progressed first in the toes and then to the entire bottom of the feet and more recently up to the ankles.  Legs feel weak.  He can't bend down to pick up the ball when playing golf.  He is unsteady on his feet and has fallen.  He has trouble walking up even the slightest incline.  No back or radicular pain.  No symptoms in the hands.    He tried B6 which was ineffective.   No history of diabetes.   He does not drink alcohol. He was exposed to [place] in [place].  Otherwise, no known exposure to chemicals or heavy metals.   No known family history of neuropathy.    PAST MEDICAL HISTORY: Past Medical History:  Diagnosis Date  . Cancer ([place])    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  . Hypertension     MEDICATIONS: Current Outpatient Medications on File Prior to Visit  Medication Sig Dispense Refill  . aspirin 81 MG EC tablet Take 81 mg by mouth daily.    Marland Kitchen atorvastatin (LIPITOR) 40 MG tablet Take 40 mg by mouth daily.    . carvedilol (COREG) 6.25 MG tablet  Take 6.2 mg by mouth daily.    Marland Kitchen gabapentin (NEURONTIN) 100 MG capsule Take 3 capsules (300 mg total) by mouth at bedtime. 90 capsule 0  . lisinopril (PRINIVIL,ZESTRIL) 10 MG tablet Take 10 mg by mouth daily.    . methocarbamol (ROBAXIN) 500 MG tablet Take 1 tablet (500 mg total) by mouth 3 (three) times daily as needed. 60 tablet 1  . oxyCODONE-acetaminophen (ROXICET) 5-325 MG tablet Take 1-2 tablets by mouth every 4 (four) hours as needed for severe pain. (Patient not taking: Reported on 03/08/2020) 60 tablet 0  . pravastatin (PRAVACHOL) 20 MG tablet Take 20 mg by mouth daily.     No current facility-administered medications on file prior to visit.    ALLERGIES: No Known Allergies  FAMILY HISTORY: No family history on file.   SOCIAL HISTORY: Social History   Socioeconomic History  . Marital status: Single    Spouse name: Not on file  . Number of children: Not on file  . Years of education: Not on file  . Highest education level: Not on file  Occupational History  . Not on file  Tobacco Use  . Smoking status: Never Smoker  . Smokeless tobacco: Never Used  Substance and Sexual Activity  . Alcohol use: No  . Drug use: No  . Sexual activity: Not on file  Other Topics Concern  .  Not on file  Social History Narrative  . Not on file   Social Determinants of Health   Financial Resource Strain:   . Difficulty of Paying Living Expenses: Not on file  Food Insecurity:   . Worried About Charity fundraiser in the Last Year: Not on file  . Ran Out of Food in the Last Year: Not on file  Transportation Needs:   . Lack of Transportation (Medical): Not on file  . Lack of Transportation (Non-Medical): Not on file  Physical Activity:   . Days of Exercise per Week: Not on file  . Minutes of Exercise per Session: Not on file  Stress:   . Feeling of Stress : Not on file  Social Connections:   . Frequency of Communication with Friends and Family: Not on file  . Frequency of Social  Gatherings with Friends and Family: Not on file  . Attends Religious Services: Not on file  . Active Member of Clubs or Organizations: Not on file  . Attends Archivist Meetings: Not on file  . Marital Status: Not on file  Intimate Partner Violence:   . Fear of Current or Ex-Partner: Not on file  . Emotionally Abused: Not on file  . Physically Abused: Not on file  . Sexually Abused: Not on file      PHYSICAL EXAM: Blood pressure (!) 159/76, pulse 74, resp. rate 18, height 6' (1.829 m), weight 166 lb (75.3 kg), SpO2 99 %. General: No acute distress.  Patient appears well-groomed.    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  May have false positive SSA antibody but will be following up with rheumatology.    PLAN: He will follow up with me as needed.  As the neuropathy only causes numbness and not pain, medications not likely to be effective.    Metta Clines, DO

## 2020-07-26 ENCOUNTER — Ambulatory Visit (INDEPENDENT_AMBULATORY_CARE_PROVIDER_SITE_OTHER): Payer: No Typology Code available for payment source | Admitting: Neurology

## 2020-07-26 ENCOUNTER — Encounter: Payer: Self-pay | Admitting: Neurology

## 2020-07-26 ENCOUNTER — Other Ambulatory Visit: Payer: Self-pay

## 2020-07-26 VITALS — BP 159/76 | HR 74 | Resp 18 | Ht 72.0 in | Wt 166.0 lb

## 2020-07-26 DIAGNOSIS — M5417 Radiculopathy, lumbosacral region: Secondary | ICD-10-CM | POA: Diagnosis not present

## 2020-07-26 DIAGNOSIS — G6289 Other specified polyneuropathies: Secondary | ICD-10-CM

## 2021-06-22 ENCOUNTER — Other Ambulatory Visit: Payer: Self-pay

## 2021-09-06 ENCOUNTER — Telehealth: Payer: Self-pay | Admitting: Cardiology

## 2021-09-06 NOTE — Telephone Encounter (Signed)
New Message:      Patient wanted to know if his referral from the [place]  have been received

## 2021-09-07 NOTE — Telephone Encounter (Signed)
Good Morning Caren Griffins,  I didn't know who's doing [facility] referrals since [facility]ann[facility] Patella is out, however, [name]- MRN [id number] wants to know if a referral from the [place] was received for him.  Thanks

## 2021-09-07 NOTE — Telephone Encounter (Signed)
Hello [name],  I left [name] a voicemail letting him know that his referral was received and that he'd be contacted soon to set up an appointment

## 2021-09-09 ENCOUNTER — Telehealth: Payer: Self-pay

## 2021-09-09 NOTE — Telephone Encounter (Signed)
NOTES SCANNED TO REFERRAL 

## 2021-10-09 NOTE — Progress Notes (Signed)
Cardiology Office Note:    Date:  10/10/2021   ID:  [name], DOB 09/19/1946, MRN [id number]  PCP:  Pcp, No  Cardiologist:  None  Electrophysiologist:  None   Referring MD: No ref. provider found   Chief Complaint  Patient presents with   Coronary Artery Disease    History of Present Illness:    [name] is a 76 y.o. male with a hx of CAD, hypertension, colon cancer who presents for evaluation of CAD.  He has a history of CABG in 2019 with SVG-RCA, LIMA to [place] forming Y graft that inserts into LAD and OM branch.  He reports he has been feeling very short of breath recently, particularly with walking up hills.  He typically golfs 3 days/week but has been limited by shortness of breath recently.  He also reports having episodes of dizziness, especially when he walks.  Denies any syncopal episodes.  Reports has had intermittent chest pain as well.  Describes as left-sided chest pain.  [facility] [place] and underwent cath on 08/31/2021, which showed severe native CAD (40% left main, 50% proximal LAD, 75% mid LAD, 80% proximal LCx, 50% mid LCx, 70% proximal RCA, 80% mid RCA, 100% distal RCA, 80% right PDA), patent SVG to RCA, LIMA to [place] Y graft with patent segment to LAD and occluded segment to obtuse marginal.  He was referred for consideration of PCI to proximal LCx.    Past Medical History:  Diagnosis Date   Cancer ([place])    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   Hypertension     Past Surgical History:  Procedure Laterality Date   HIP ARTHROPLASTY     BILAT   NECK MASS EXCISION     RT SIDE LYMPH NODE REMOVED   SHOULDER ARTHROSCOPY WITH SUBACROMIAL DECOMPRESSION AND OPEN ROTATOR C Left 08/13/2015   Procedure: LEFT SHOULDER ARTHROSCOPY WITH SUBACROMIAL DECOMPRESSION AND MINI OPEN ROTATOR CUFF REPAIR, BICEPS TENODESIS AND OPEN DISTAL CLAVICLE RESECTION;  Surgeon: Netta Cedars, MD;  Location: [facility];  Service: Orthopedics;  Laterality: Left;   TONSILLECTOMY      WRIST SURGERY     LEFT    Current Medications: Current Meds  Medication Sig   aspirin 81 MG EC tablet Take 81 mg by mouth daily.   atorvastatin (LIPITOR) 40 MG tablet Take 40 mg by mouth daily.   gabapentin (NEURONTIN) 100 MG capsule Take 3 capsules (300 mg total) by mouth at bedtime.   lisinopril (PRINIVIL,ZESTRIL) 10 MG tablet Take 10 mg by mouth daily.   methocarbamol (ROBAXIN) 500 MG tablet Take 1 tablet (500 mg total) by mouth 3 (three) times daily as needed.   oxyCODONE-acetaminophen (ROXICET) 5-325 MG tablet Take 1-2 tablets by mouth every 4 (four) hours as needed for severe pain.   pravastatin (PRAVACHOL) 20 MG tablet Take 20 mg by mouth daily.   [DISCONTINUED] carvedilol (COREG) 3.125 MG tablet Take 1 tablet (3.125 mg total) by mouth 2 (two) times daily.   [DISCONTINUED] carvedilol (COREG) 6.25 MG tablet Take 6.2 mg by mouth daily.   [DISCONTINUED] nitroGLYCERIN (NITROSTAT) 0.4 MG SL tablet Place 1 tablet (0.4 mg total) under the tongue every 5 (five) minutes as needed for chest pain.     Allergies:   Hydrochlorothiazide   Social History   Socioeconomic History   Marital status: Single    Spouse name: Not on file   Number of children: Not on file   Years of education: Not on file  Highest education level: Not on file  Occupational History   Not on file  Tobacco Use   Smoking status: Never   Smokeless tobacco: Never  Substance and Sexual Activity   Alcohol use: No   Drug use: No   Sexual activity: Not on file  Other Topics Concern   Not on file  Social History Narrative   Left handed   One story home    Drinks no cafffeine   Social Determinants of Health   Financial Resource Strain: Not on file  Food Insecurity: Not on file  Transportation Needs: Not on file  Physical Activity: Not on file  Stress: Not on file  Social Connections: Not on file     Family History: The patient's family history is not on file.  ROS:   Please see the history of  present illness.     All other systems reviewed and are negative.  EKGs/Labs/Other Studies Reviewed:    The following studies were reviewed today:   EKG:  EKG is ordered today.  The ekg ordered today demonstrates sinus bradycardia, rate 48, incomplete right bundle branch block  Recent Labs: No results found for requested labs within last 8760 hours.  Recent Lipid Panel No results found for: CHOL, TRIG, HDL, CHOLHDL, VLDL, LDLCALC, LDLDIRECT  Physical Exam:    VS:  BP (!) 150/66    Pulse (!) 48    Ht 6' (1.829 m)    Wt 166 lb 9.6 oz (75.6 kg)    BMI 22.60 kg/m     Wt Readings from Last 3 Encounters:  10/10/21 166 lb 9.6 oz (75.6 kg)  07/26/20 166 lb (75.3 kg)  03/08/20 166 lb 12.8 oz (75.7 kg)     GEN:  Well nourished, well developed in no acute distress HEENT: Normal NECK: No JVD; No carotid bruits LYMPHATICS: No lymphadenopathy CARDIAC: RRR, no murmurs, rubs, gallops RESPIRATORY:  Clear to auscultation without rales, wheezing or rhonchi  ABDOMEN: Soft, non-tender, non-distended MUSCULOSKELETAL:  No edema; No deformity  SKIN: Warm and dry NEUROLOGIC:  Alert and oriented x 3 PSYCHIATRIC:  Normal affect   ASSESSMENT:    1. Coronary artery disease of native artery of native heart with stable angina pectoris ([place])   2. Essential hypertension   3. Hyperlipidemia, unspecified hyperlipidemia type    PLAN:    CAD: s/p CABG in 2019 with SVG-RCA, LIMA to [place] forming Y graft that inserts into LAD and OM branch.  He is having chest pain and DOE.  He follows with cardiology at the [facility] in [place] and underwent cath on 08/31/2021, which showed severe native CAD (40% left main, 50% proximal LAD, 75% mid LAD, 80% proximal LCx, 50% mid LCx, 70% proximal RCA, 80% mid RCA, 100% distal RCA, 80% right PDA), patent SVG to RCA, LIMA to [place] Y graft with patent segment to LAD and occluded segment to obtuse marginal.  He was referred for consideration of PCI to proximal LCx.   -Will obtain  records from the [place] -Will review cath films with Dr. Martinique to evaluate for PCI to proximal LCx -Continue aspirin, statin -Reduce carvedilol to 3.125 mg twice daily has resting heart rate in 40s and having lightheadedness -As needed sublingual nitroglycerin  Hypertension: On lisinopril 10 mg daily, carvedilol 6.25 mg twice daily.  BP elevated in clinic today but reports has been under control at home.  Will reduce carvedilol given bradycardia/lightheadedness as above.  Asked to check BP daily for next 2 weeks and call with  results.  Hyperlipidemia: Continue atorvastatin 40 mg daily     Medication Adjustments/Labs and Tests Ordered: Current medicines are reviewed at length with the patient today.  Concerns regarding medicines are outlined above.  Orders Placed This Encounter  Procedures   EKG 12-Lead   Meds ordered this encounter  Medications   DISCONTD: nitroGLYCERIN (NITROSTAT) 0.4 MG SL tablet    Sig: Place 1 tablet (0.4 mg total) under the tongue every 5 (five) minutes as needed for chest pain.    Dispense:  25 tablet    Refill:  6   carvedilol (COREG) 3.125 MG tablet    Sig: Take 1 tablet (3.125 mg total) by mouth 2 (two) times daily.    Dispense:  180 tablet    Refill:  3   nitroGLYCERIN (NITROSTAT) 0.4 MG SL tablet    Sig: Place 1 tablet (0.4 mg total) under the tongue every 5 (five) minutes as needed for chest pain.    Dispense:  25 tablet    Refill:  6    Patient Instructions  Medication Instructions:  Start NTG under tongue as needed for chest pain Decrease Carvedilol to 3.125 mg twice a day Continue all other medications    Lab Work: None ordered   Testing/Procedures: Cardiac Cath to be determined    Follow-Up: At [facility], you and your health needs are our priority.  As part of our continuing mission to provide you with exceptional heart care, we have created designated Provider Care Teams.  These Care Teams include your primary Cardiologist  (physician) and Advanced Practice Providers (APPs -  Physician Assistants and Nurse Practitioners) who all work together to provide you with the care you need, when you need it.  We recommend signing up for the patient portal called "MyChart".  Sign up information is provided on this After Visit Summary.  MyChart is used to connect with patients for Virtual Visits (Telemedicine).  Patients are able to view lab/test results, encounter notes, upcoming appointments, etc.  Non-urgent messages can be sent to your provider as well.   To learn more about what you can do with MyChart, go to [email or web address].     Check blood pressure daily and call office in 2 weeks to report readings   Your next appointment:  To Be Determined    The format for your next appointment:  Office     Provider:  Dr.Andalyn Heckstall 1}    Dr.Jordan will review cardiac cath  Cath to be scheduled after he reviews    Signed, Donato Heinz, MD  10/10/2021 10:42 PM    [facility]

## 2021-10-10 ENCOUNTER — Ambulatory Visit (INDEPENDENT_AMBULATORY_CARE_PROVIDER_SITE_OTHER): Payer: No Typology Code available for payment source | Admitting: Cardiology

## 2021-10-10 ENCOUNTER — Encounter: Payer: Self-pay | Admitting: Cardiology

## 2021-10-10 ENCOUNTER — Other Ambulatory Visit: Payer: Self-pay

## 2021-10-10 VITALS — BP 150/66 | HR 48 | Ht 72.0 in | Wt 166.6 lb

## 2021-10-10 DIAGNOSIS — I25118 Atherosclerotic heart disease of native coronary artery with other forms of angina pectoris: Secondary | ICD-10-CM | POA: Diagnosis not present

## 2021-10-10 DIAGNOSIS — I10 Essential (primary) hypertension: Secondary | ICD-10-CM

## 2021-10-10 DIAGNOSIS — E785 Hyperlipidemia, unspecified: Secondary | ICD-10-CM

## 2021-10-10 MED ORDER — NITROGLYCERIN 0.4 MG SL SUBL: 0.4000 mg | SUBLINGUAL_TABLET | SUBLINGUAL | 6 refills | Status: DC | PRN

## 2021-10-10 MED ORDER — CARVEDILOL 3.125 MG PO TABS: 3.1250 mg | ORAL_TABLET | Freq: Two times a day (BID) | ORAL | 3 refills | Status: AC

## 2021-10-10 NOTE — Patient Instructions (Addendum)
Medication Instructions:  Start NTG under tongue as needed for chest pain Decrease Carvedilol to 3.125 mg twice a day Continue all other medications    Lab Work: None ordered   Testing/Procedures: Cardiac Cath to be determined    Follow-Up: At [facility], you and your health needs are our priority.  As part of our continuing mission to provide you with exceptional heart care, we have created designated Provider Care Teams.  These Care Teams include your primary Cardiologist (physician) and Advanced Practice Providers (APPs -  Physician Assistants and Nurse Practitioners) who all work together to provide you with the care you need, when you need it.  We recommend signing up for the patient portal called "MyChart".  Sign up information is provided on this After Visit Summary.  MyChart is used to connect with patients for Virtual Visits (Telemedicine).  Patients are able to view lab/test results, encounter notes, upcoming appointments, etc.  Non-urgent messages can be sent to your provider as well.   To learn more about what you can do with MyChart, go to [email or web address].     Check blood pressure daily and call office in 2 weeks to report readings   Your next appointment:  To Be Determined    The format for your next appointment:  Office     Provider:  Dr.Schumann 1}    Dr.Jordan will review cardiac cath  Cath to be scheduled after he reviews

## 2021-10-13 ENCOUNTER — Telehealth: Payer: Self-pay | Admitting: Cardiology

## 2021-10-13 MED ORDER — NITROGLYCERIN 0.4 MG SL SUBL: 0.4000 mg | SUBLINGUAL_TABLET | SUBLINGUAL | 6 refills | Status: DC | PRN

## 2021-10-13 NOTE — Telephone Encounter (Signed)
Spoke with patient who wanted to know when he is having a stent procedure.

## 2021-10-13 NOTE — Telephone Encounter (Signed)
Note    Pt c/o medication issue:   1. Name of Medication: nitroGLYCERIN (NITROSTAT) 0.4 MG SL tablet   2. How are you currently taking this medication (dosage and times per day)? As written   3. Are you having a reaction (difficulty breathing--STAT)? No    4. What is your medication issue? Patient called and mentioned that he would like his prescription sent to his home at [address]; [place],  [address]

## 2021-10-13 NOTE — Telephone Encounter (Signed)
Spoke with patient who said his [facility] never received the NTG order. Upon reviewing the chart, I changed his pharmacy, sent in the order, and informed him.

## 2021-10-13 NOTE — Telephone Encounter (Signed)
Pt c/o medication issue:  1. Name of Medication: nitroGLYCERIN (NITROSTAT) 0.4 MG SL tablet  2. How are you currently taking this medication (dosage and times per day)? As written  3. Are you having a reaction (difficulty breathing--STAT)? No   4. What is your medication issue? Patient called and mentioned that he would like his prescription sent to his home at [address] [place], [place] [address]

## 2021-10-14 NOTE — Telephone Encounter (Signed)
Dr Martinique will review the cath films when he returns next week and decide about the stent, we will be in touch next week

## 2021-10-14 NOTE — Telephone Encounter (Signed)
Patient is aware of Dr Newman Nickels comments, He gave a verbal understanding and will be waiting for someone to be in touch next week.

## 2021-10-17 ENCOUNTER — Telehealth: Payer: Self-pay | Admitting: Cardiology

## 2021-10-17 NOTE — Telephone Encounter (Signed)
Spoke to patient appointment scheduled with Dr.Jordan 10/19/21 at 9:40 am.

## 2021-10-17 NOTE — Telephone Encounter (Signed)
Reviewed cath films with Dr Martinique.  There may be a severe LCX stenosis but poorly visualized.  Recommend repeat cath for further evaluation, may need FFR to determine if severe. I spoke with [name], he is in agreement with proceeding with cath.  Malachy Mood, could we schedule him with Dr Martinique sometime in next week or so?

## 2021-10-17 NOTE — Telephone Encounter (Deleted)
Spoke to patient appointment scheduled with Dr.Jordan Wed 1/18 at 9:40 am.

## 2021-10-18 NOTE — Progress Notes (Signed)
Cardiology Office Note   Date:  10/19/2021   ID:  [name], DOB 1946/03/11, MRN [id number]  PCP:  Pcp, No  Cardiologist:   Jakeim Sedore [place], MD   Chief Complaint  Patient presents with   Follow-up   Headache   Shortness of Breath      History of Present Illness: [name] is a 76 y.o. male who is seen at the request of Dr Gardiner Rhyme for consideration of PCI. He has a hx of CAD, hypertension, colon cancer.  He has a history of CABG in 2019 by Dr Clementeen Graham at [facility] with [place], LIMA to LIMA to the LAD and apparent free RIMA forming Y graft to the OM branch. 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  He presented with complaints of short of breath recently, particularly with walking up hills.  He typically golfs 3 days/week but has been limited by shortness of breath. He also reports having episodes of dizziness, especially when he walks.  Denies any syncopal episodes.  Reports has had intermittent chest pain as well.  Describes as left-sided chest pain.  [facility] [place] [facility] on [facility]/2021, reportedly showing severe native CAD (40% left main, 50% proximal LAD, 75% mid LAD, 80% proximal LCx, 50% mid LCx, 70% proximal RCA, 80% mid RCA, 100% distal RCA, 80% right PDA), patent SVG to RCA, LIMA to LAD patent but Y RIMA to the OM occluded.   He was referred for consideration of PCI to proximal LCx and seen recently by Dr Gardiner Rhyme who asked me to review films.   He reports that he gets SOB with activity just walking down his driveway. Previously able to walk and play golf without difficulty. Notes fatigue. Some CP with inspiration. Notes symptoms similar to what he had prior to CABG. No bleeding history. Notes he had recent CT of carotids and this was OK.     Past Medical History:  Diagnosis Date   Cancer ([place])    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   Hypertension     Past Surgical History:   Procedure Laterality Date   HIP ARTHROPLASTY     BILAT   NECK MASS EXCISION     RT SIDE LYMPH NODE REMOVED   SHOULDER ARTHROSCOPY WITH SUBACROMIAL DECOMPRESSION AND OPEN ROTATOR C Left 08/13/2015   Procedure: LEFT SHOULDER ARTHROSCOPY WITH SUBACROMIAL DECOMPRESSION AND MINI OPEN ROTATOR CUFF REPAIR, BICEPS TENODESIS AND OPEN DISTAL CLAVICLE RESECTION;  Surgeon: Netta Cedars, MD;  Location: [facility];  Service: Orthopedics;  Laterality: Left;   TONSILLECTOMY     WRIST SURGERY     LEFT     Current Outpatient Medications  Medication Sig Dispense Refill   aspirin 81 MG EC tablet Take 81 mg by mouth daily.     atorvastatin (LIPITOR) 40 MG tablet Take 40 mg by mouth daily.     carvedilol (COREG) 3.125 MG tablet Take 1 tablet (3.125 mg total) by mouth 2 (two) times daily. 180 tablet 3   lisinopril (PRINIVIL,ZESTRIL) 10 MG tablet Take 10 mg by mouth daily.     nitroGLYCERIN (NITROSTAT) 0.4 MG SL tablet Place 1 tablet (0.4 mg total) under the tongue every 5 (five) minutes as needed for chest pain. 25 tablet 6   clopidogrel (PLAVIX) 75 MG tablet Take 1 tablet (75 mg total) by mouth daily. 90 tablet 3   No current facility-administered medications for this visit.  Allergies:   Hydrochlorothiazide    Social History:  The patient  reports that he has never smoked. He has never used smokeless tobacco. He reports that he does not drink alcohol and does not use drugs.   Family History:  The patient's is negative for premature CAD.   ROS:  Please see the history of present illness.   Otherwise, review of systems are positive for none.   All other systems are reviewed and negative.    PHYSICAL EXAM: VS:  BP (!) 110/50 (BP Location: Left Arm, Patient Position: Sitting, Cuff Size: Normal)    Pulse (!) 56    Ht 6' (1.829 m)    Wt 165 lb (74.8 kg)    BMI 22.38 kg/m  , BMI Body mass index is 22.38 kg/m. GEN: Well nourished, well developed, in no acute distress HEENT: normal Neck: no JVD, carotid  bruits, or masses Cardiac: RRR; no murmurs, rubs, or gallops,no edema  Respiratory:  clear to auscultation bilaterally, normal work of breathing GI: soft, nontender, nondistended, + BS MS: no deformity or atrophy Skin: warm and dry, no rash Neuro:  Strength and sensation are intact Psych: euthymic mood, full affect   EKG:  EKG is not ordered today. The ekg ordered today demonstrates N/A   Recent Labs: No results found for requested labs within last 8760 hours.    Lipid Panel No results found for: CHOL, TRIG, HDL, CHOLHDL, VLDL, LDLCALC, LDLDIRECT    Wt Readings from Last 3 Encounters:  10/19/21 165 lb (74.8 kg)  10/10/21 166 lb 9.6 oz (75.6 kg)  07/26/20 166 lb (75.3 kg)      Other studies Reviewed: Additional studies/ records that were reviewed today include: see below.   ASSESSMENT AND PLAN:  1.  CAD: s/p CABG in 2019 with SVG-RCA, LIMA to  LAD and Free RIMA Y graft to OM branch.  He is having chest pain and DOE.  He follows with cardiology at the [facility] in [place]. I personally reviewed his recent cardiac cath films. LIMA to the LAD is patent as is SVG to RCA. No visualization of Y graft to OM. The native LCx demonstrates a 80% stenosis proximally. There is modest calcification. I also reviewed his Echo images. This showed normal LV function with EF 60%. There is mild TR and mild biatrial enlargement. No effusion. He has DOE and fatigue that are his anginal equivalent. Class 3.  -Continue aspirin, statin -on low dose Coreg -As needed sublingual nitroglycerin - Recommend PCI of the LCx. Will initiate Plavix 300 mg the first day then 75 mg daily.  - PCI scheduled this coming Monday Jan 23. - The procedure and risks were reviewed including but not limited to death, myocardial infarction, stroke, arrythmias, bleeding, transfusion, emergency surgery, dye allergy, or renal dysfunction. The patient voices understanding and is agreeable to proceed..   2. Hypertension: On  lisinopril 10 mg daily, carvedilol 3.125 mg twice daily.  controlled.   3. Hyperlipidemia: Continue atorvastatin 40 mg daily     Current medicines are reviewed at length with the patient today.  The patient does not have concerns regarding medicines.  The following changes have been made:  Add Plavix as noted  Labs/ tests ordered today include:  CBC, PT, BMET        Disposition:   PCI on Monday  Signed, Laporscha Linehan [place]ique, MD  10/19/2021 9:50 AM    [facility] [address], [place], [place], [address] Phone [phone], Fax [phone]

## 2021-10-18 NOTE — H&P (View-Only) (Signed)
Cardiology Office Note   Date:  10/19/2021   ID:  [name], DOB 1946-09-23, MRN [id number]  PCP:  Pcp, No  Cardiologist:   Brailee Riede Martinique, MD   Chief Complaint  Patient presents with   Follow-up   Headache   Shortness of Breath      History of Present Illness: [name] is a 76 y.o. male who is seen at the request of Dr Gardiner Rhyme for consideration of PCI. He has a hx of CAD, hypertension, colon cancer.  He has a history of CABG in 2019 by Dr Clementeen Graham at [facility] with [place], LIMA to LIMA to the LAD and apparent free RIMA forming Y graft to the OM branch. 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  He presented with complaints of short of breath recently, particularly with walking up hills.  He typically golfs 3 days/week but has been limited by shortness of breath. He also reports having episodes of dizziness, especially when he walks.  Denies any syncopal episodes.  Reports has had intermittent chest pain as well.  Describes as left-sided chest pain.  [facility] [place] [facility] on 08/31/2021, reportedly showing severe native CAD (40% left main, 50% proximal LAD, 75% mid LAD, 80% proximal LCx, 50% mid LCx, 70% proximal RCA, 80% mid RCA, 100% distal RCA, 80% right PDA), patent SVG to RCA, LIMA to LAD patent but Y RIMA to the OM occluded.   He was referred for consideration of PCI to proximal LCx and seen recently by Dr Gardiner Rhyme who asked me to review films.   He reports that he gets SOB with activity just walking down his driveway. Previously able to walk and play golf without difficulty. Notes fatigue. Some CP with inspiration. Notes symptoms similar to what he had prior to CABG. No bleeding history. Notes he had recent CT of carotids and this was OK.     Past Medical History:  Diagnosis Date   Cancer ([place])    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   Hypertension     Past Surgical History:   Procedure Laterality Date   HIP ARTHROPLASTY     BILAT   NECK MASS EXCISION     RT SIDE LYMPH NODE REMOVED   SHOULDER ARTHROSCOPY WITH SUBACROMIAL DECOMPRESSION AND OPEN ROTATOR C Left 08/13/2015   Procedure: LEFT SHOULDER ARTHROSCOPY WITH SUBACROMIAL DECOMPRESSION AND MINI OPEN ROTATOR CUFF REPAIR, BICEPS TENODESIS AND OPEN DISTAL CLAVICLE RESECTION;  Surgeon: Netta Cedars, MD;  Location: [facility];  Service: Orthopedics;  Laterality: Left;   TONSILLECTOMY     WRIST SURGERY     LEFT     Current Outpatient Medications  Medication Sig Dispense Refill   aspirin 81 MG EC tablet Take 81 mg by mouth daily.     atorvastatin (LIPITOR) 40 MG tablet Take 40 mg by mouth daily.     carvedilol (COREG) 3.125 MG tablet Take 1 tablet (3.125 mg total) by mouth 2 (two) times daily. 180 tablet 3   lisinopril (PRINIVIL,ZESTRIL) 10 MG tablet Take 10 mg by mouth daily.     nitroGLYCERIN (NITROSTAT) 0.4 MG SL tablet Place 1 tablet (0.4 mg total) under the tongue every 5 (five) minutes as needed for chest pain. 25 tablet 6   clopidogrel (PLAVIX) 75 MG tablet Take 1 tablet (75 mg total) by mouth daily. 90 tablet 3   No current facility-administered medications for this visit.  Allergies:   Hydrochlorothiazide    Social History:  The patient  reports that he has never smoked. He has never used smokeless tobacco. He reports that he does not drink alcohol and does not use drugs.   Family History:  The patient's is negative for premature CAD.   ROS:  Please see the history of present illness.   Otherwise, review of systems are positive for none.   All other systems are reviewed and negative.    PHYSICAL EXAM: VS:  BP (!) 110/50 (BP Location: Left Arm, Patient Position: Sitting, Cuff Size: Normal)    Pulse (!) 56    Ht 6' (1.829 m)    Wt 165 lb (74.8 kg)    BMI 22.38 kg/m  , BMI Body mass index is 22.38 kg/m. GEN: Well nourished, well developed, in no acute distress HEENT: normal Neck: no JVD, carotid  bruits, or masses Cardiac: RRR; no murmurs, rubs, or gallops,no edema  Respiratory:  clear to auscultation bilaterally, normal work of breathing GI: soft, nontender, nondistended, + BS MS: no deformity or atrophy Skin: warm and dry, no rash Neuro:  Strength and sensation are intact Psych: euthymic mood, full affect   EKG:  EKG is not ordered today. The ekg ordered today demonstrates N/A   Recent Labs: No results found for requested labs within last 8760 hours.    Lipid Panel No results found for: CHOL, TRIG, HDL, CHOLHDL, VLDL, LDLCALC, LDLDIRECT    Wt Readings from Last 3 Encounters:  10/19/21 165 lb (74.8 kg)  10/10/21 166 lb 9.6 oz (75.6 kg)  07/26/20 166 lb (75.3 kg)      Other studies Reviewed: Additional studies/ records that were reviewed today include: see below.   ASSESSMENT AND PLAN:  1.  CAD: s/p CABG in 2019 with SVG-RCA, LIMA to  LAD and Free RIMA Y graft to OM branch.  He is having chest pain and DOE.  He follows with cardiology at the [facility] in [place]. I personally reviewed his recent cardiac cath films. LIMA to the LAD is patent as is SVG to RCA. No visualization of Y graft to OM. The native LCx demonstrates a 80% stenosis proximally. There is modest calcification. I also reviewed his Echo images. This showed normal LV function with EF 60%. There is mild TR and mild biatrial enlargement. No effusion. He has DOE and fatigue that are his anginal equivalent. Class 3.  -Continue aspirin, statin -on low dose Coreg -As needed sublingual nitroglycerin - Recommend PCI of the LCx. Will initiate Plavix 300 mg the first day then 75 mg daily.  - PCI scheduled this coming Monday Jan 23. - The procedure and risks were reviewed including but not limited to death, myocardial infarction, stroke, arrythmias, bleeding, transfusion, emergency surgery, dye allergy, or renal dysfunction. The patient voices understanding and is agreeable to proceed..   2. Hypertension: On  lisinopril 10 mg daily, carvedilol 3.125 mg twice daily.  controlled.   3. Hyperlipidemia: Continue atorvastatin 40 mg daily     Current medicines are reviewed at length with the patient today.  The patient does not have concerns regarding medicines.  The following changes have been made:  Add Plavix as noted  Labs/ tests ordered today include:  CBC, PT, BMET        Disposition:   PCI on Monday  Signed, Niharika Savino [place], MD  10/19/2021 9:50 AM    [facility] [address] [place], [place], [address] Phone [phone], Fax [phone]

## 2021-10-19 ENCOUNTER — Other Ambulatory Visit: Payer: Self-pay | Admitting: Cardiology

## 2021-10-19 ENCOUNTER — Other Ambulatory Visit: Payer: Self-pay

## 2021-10-19 ENCOUNTER — Ambulatory Visit (INDEPENDENT_AMBULATORY_CARE_PROVIDER_SITE_OTHER): Payer: No Typology Code available for payment source | Admitting: Cardiology

## 2021-10-19 ENCOUNTER — Encounter: Payer: Self-pay | Admitting: Cardiology

## 2021-10-19 VITALS — BP 110/50 | HR 56 | Ht 72.0 in | Wt 165.0 lb

## 2021-10-19 DIAGNOSIS — I10 Essential (primary) hypertension: Secondary | ICD-10-CM

## 2021-10-19 DIAGNOSIS — E785 Hyperlipidemia, unspecified: Secondary | ICD-10-CM | POA: Diagnosis not present

## 2021-10-19 DIAGNOSIS — I209 Angina pectoris, unspecified: Secondary | ICD-10-CM

## 2021-10-19 DIAGNOSIS — I25118 Atherosclerotic heart disease of native coronary artery with other forms of angina pectoris: Secondary | ICD-10-CM | POA: Diagnosis not present

## 2021-10-19 DIAGNOSIS — Z01812 Encounter for preprocedural laboratory examination: Secondary | ICD-10-CM

## 2021-10-19 MED ORDER — SODIUM CHLORIDE 0.9% FLUSH: 3.0000 mL | Freq: Two times a day (BID) | INTRAVENOUS | Status: DC

## 2021-10-19 MED ORDER — CLOPIDOGREL BISULFATE 75 MG PO TABS: 75.0000 mg | ORAL_TABLET | Freq: Every day | ORAL | 3 refills | Status: DC

## 2021-10-19 NOTE — Patient Instructions (Addendum)
Medication Instructions:  Start Plavix 75 mg take 4 tablets today then take 1 tablet daily Continue all other medications *If you need a refill on your cardiac medications before your next appointment, please call your pharmacy*   Lab Work: Bmet,cbc,pt today   Testing/Procedures: Cardiac Cath scheduled 10/24/21  Follow instructions below   Follow-Up: At [facility], you and your health needs are our priority.  As part of our continuing mission to provide you with exceptional heart care, we have created designated Provider Care Teams.  These Care Teams include your primary Cardiologist (physician) and Advanced Practice Providers (APPs -  Physician Assistants and Nurse Practitioners) who all work together to provide you with the care you need, when you need it.  We recommend signing up for the patient portal called "MyChart".  Sign up information is provided on this After Visit Summary.  MyChart is used to connect with patients for Virtual Visits (Telemedicine).  Patients are able to view lab/test results, encounter notes, upcoming appointments, etc.  Non-urgent messages can be sent to your provider as well.   To learn more about what you can do with MyChart, go to [email or web address].      Your next appointment:      The format for your next appointment: Office   Provider:  [place] [address] Dept: [phone] Loc: [place]  10/19/2021  You are scheduled for a Cardiac Catheterization PCI of circumflex  on Monday 10/24/21, with Dr. Peter Martinique.  1. Please arrive at the [facility] (Main Entrance A) at [facility]: [address] [place], [place] [address] at 7:00 AM (This time is two hours before your procedure to ensure your preparation). Free valet parking service is available.   Special note: Every effort is  made to have your procedure done on time. Please understand that emergencies sometimes delay scheduled procedures.  2. Diet: Do not eat solid foods after midnight.  The patient may have clear liquids until 5am upon the day of the procedure.  3. Labs: You will need to have blood drawn on Wed 1/18 at [place] office.You do not need to be fasting.  4. Medication instructions in preparation for your procedure:       On the morning of your procedure, take your Aspirin and Plavix and any morning medicines NOT listed above.  You may use sips of water.  5. Plan for one night stay--bring personal belongings. 6. Bring a current list of your medications and current insurance cards. 7. You MUST have a responsible person to drive you home. 8. Someone MUST be with you the first 24 hours after you arrive home or your discharge will be delayed. 9. Please wear clothes that are easy to get on and off and wear slip-on shoes.  Thank you for allowing [place] to care for you!   -- [facility]

## 2021-10-20 ENCOUNTER — Telehealth: Payer: Self-pay | Admitting: *Deleted

## 2021-10-20 ENCOUNTER — Ambulatory Visit
Admission: RE | Admit: 2021-10-20 | Discharge: 2021-10-20 | Disposition: A | Discharge status: Home / Self Care | Payer: Self-pay | Source: Ambulatory Visit | Attending: Cardiology | Admitting: Cardiology

## 2021-10-20 LAB — CBC WITH DIFFERENTIAL/PLATELET
Basophils Absolute: 0 10*3/uL (ref 0.0–0.2)
Basos: 1 %
EOS (ABSOLUTE): 0.2 10*3/uL (ref 0.0–0.4)
Eos: 6 %
Hematocrit: 39.4 % (ref 37.5–51.0)
Hemoglobin: 13 g/dL (ref 13.0–17.7)
Immature Grans (Abs): 0 10*3/uL (ref 0.0–0.1)
Immature Granulocytes: 0 %
Lymphocytes Absolute: 0.7 10*3/uL (ref 0.7–3.1)
Lymphs: 27 %
MCH: 31.1 pg (ref 26.6–33.0)
MCHC: 33 g/dL (ref 31.5–35.7)
MCV: 94 fL (ref 79–97)
Monocytes Absolute: 0.2 10*3/uL (ref 0.1–0.9)
Monocytes: 8 %
Neutrophils Absolute: 1.6 10*3/uL (ref 1.4–7.0)
Neutrophils: 58 %
Platelets: 118 10*3/uL — ABNORMAL LOW (ref 150–450)
RBC: 4.18 x10E6/uL (ref 4.14–5.80)
RDW: 13.1 % (ref 11.6–15.4)
WBC: 2.7 10*3/uL — ABNORMAL LOW (ref 3.4–10.8)

## 2021-10-20 LAB — PT AND PTT
INR: 1 (ref 0.9–1.2)
Prothrombin Time: 10.8 s (ref 9.1–12.0)
aPTT: 27 s (ref 24–33)

## 2021-10-20 LAB — BASIC METABOLIC PANEL
BUN/Creatinine Ratio: 19 (ref 10–24)
BUN: 23 mg/dL (ref 8–27)
CO2: 25 mmol/L (ref 20–29)
Calcium: 9.6 mg/dL (ref 8.6–10.2)
Chloride: 109 mmol/L — ABNORMAL HIGH (ref 96–106)
Creatinine, Ser: 1.22 mg/dL (ref 0.76–1.27)
Glucose: 88 mg/dL (ref 70–99)
Potassium: 4.6 mmol/L (ref 3.5–5.2)
Sodium: 144 mmol/L (ref 134–144)
eGFR: 62 mL/min/{1.73_m2} (ref >59)

## 2021-10-20 NOTE — Telephone Encounter (Signed)
Coronary Stent scheduled at [facility] for: Monday October 24, 2021 [facility] Main Entrance A [facility]) at: 7 AM   Diet-no solid food after midnight prior to cath, clear liquids until 5 AM day of procedure.  Medication instructions for procedure: -Usual morning medications can be taken pre-cath with sips of water including aspirin 81 mg and Plavix 75 mg.    Confirmed patient has responsible adult to drive home post procedure and be with patient first 24 hours after arriving home.  [facility] does allow one visitor to accompany you and wait in the hospital waiting room while you are there for your procedure. You and your visitor will be asked to wear a mask once you enter the hospital.   Patient reports does not currently have any new symptoms concerning for COVID-19 and no household members with COVID-19 like illness.    Reviewed procedure/mask/visitor instructions with patient.

## 2021-10-21 ENCOUNTER — Ambulatory Visit
Admission: RE | Admit: 2021-10-21 | Discharge: 2021-10-21 | Disposition: A | Discharge status: Home / Self Care | Payer: Self-pay | Source: Ambulatory Visit | Attending: Cardiology | Admitting: Cardiology

## 2021-10-24 ENCOUNTER — Other Ambulatory Visit: Payer: Self-pay

## 2021-10-24 ENCOUNTER — Ambulatory Visit (HOSPITAL_COMMUNITY)
Admission: RE | Admit: 2021-10-24 | Discharge: 2021-10-24 | Disposition: A | Discharge status: Home / Self Care | Payer: No Typology Code available for payment source | Source: Ambulatory Visit | Attending: Cardiology | Admitting: Cardiology

## 2021-10-24 ENCOUNTER — Encounter (HOSPITAL_COMMUNITY)
Admission: RE | Disposition: A | Discharge status: Home / Self Care | Payer: Self-pay | Source: Ambulatory Visit | Attending: Cardiology

## 2021-10-24 DIAGNOSIS — I25729 Atherosclerosis of autologous artery coronary artery bypass graft(s) with unspecified angina pectoris: Secondary | ICD-10-CM | POA: Insufficient documentation

## 2021-10-24 DIAGNOSIS — Z79899 Other long term (current) drug therapy: Secondary | ICD-10-CM | POA: Diagnosis not present

## 2021-10-24 DIAGNOSIS — E7800 Pure hypercholesterolemia, unspecified: Secondary | ICD-10-CM | POA: Diagnosis present

## 2021-10-24 DIAGNOSIS — I10 Essential (primary) hypertension: Secondary | ICD-10-CM | POA: Insufficient documentation

## 2021-10-24 DIAGNOSIS — Z7982 Long term (current) use of aspirin: Secondary | ICD-10-CM | POA: Insufficient documentation

## 2021-10-24 DIAGNOSIS — I25119 Atherosclerotic heart disease of native coronary artery with unspecified angina pectoris: Secondary | ICD-10-CM | POA: Diagnosis not present

## 2021-10-24 DIAGNOSIS — R0609 Other forms of dyspnea: Secondary | ICD-10-CM | POA: Diagnosis not present

## 2021-10-24 DIAGNOSIS — I25810 Atherosclerosis of coronary artery bypass graft(s) without angina pectoris: Secondary | ICD-10-CM | POA: Diagnosis present

## 2021-10-24 DIAGNOSIS — I209 Angina pectoris, unspecified: Secondary | ICD-10-CM | POA: Diagnosis present

## 2021-10-24 DIAGNOSIS — Z955 Presence of coronary angioplasty implant and graft: Secondary | ICD-10-CM | POA: Insufficient documentation

## 2021-10-24 HISTORY — PX: CORONARY STENT INTERVENTION: CATH118234

## 2021-10-24 LAB — POCT ACTIVATED CLOTTING TIME: Activated Clotting Time: 402 seconds

## 2021-10-24 SURGERY — CORONARY STENT INTERVENTION
Anesthesia: LOCAL

## 2021-10-24 MED ORDER — ASPIRIN 81 MG PO CHEW: 81.0000 mg | CHEWABLE_TABLET | ORAL | Status: DC

## 2021-10-24 MED ORDER — SODIUM CHLORIDE 0.9 % WEIGHT BASED INFUSION: 1.0000 mL/kg/h | INTRAVENOUS | Status: AC

## 2021-10-24 MED ORDER — SODIUM CHLORIDE 0.9 % WEIGHT BASED INFUSION
3.0000 mL/kg/h | INTRAVENOUS | Status: DC
  Administered 2021-10-24: 3 mL/kg/h via INTRAVENOUS

## 2021-10-24 MED ORDER — HEPARIN (PORCINE) IN NACL 1000-0.9 UT/500ML-% IV SOLN
INTRAVENOUS | Status: AC
  Filled 2021-10-24: qty 1000

## 2021-10-24 MED ORDER — VERAPAMIL HCL 2.5 MG/ML IV SOLN
INTRAVENOUS | Status: DC | PRN
  Administered 2021-10-24: 10 mL via INTRA_ARTERIAL

## 2021-10-24 MED ORDER — SODIUM CHLORIDE 0.9 % IV SOLN: 250.0000 mL | INTRAVENOUS | Status: DC | PRN

## 2021-10-24 MED ORDER — MIDAZOLAM HCL 2 MG/2ML IJ SOLN
INTRAMUSCULAR | Status: DC | PRN
  Administered 2021-10-24: 1 mg via INTRAVENOUS

## 2021-10-24 MED ORDER — SODIUM CHLORIDE 0.9% FLUSH: 3.0000 mL | INTRAVENOUS | Status: DC | PRN

## 2021-10-24 MED ORDER — VERAPAMIL HCL 2.5 MG/ML IV SOLN
INTRAVENOUS | Status: AC
  Filled 2021-10-24: qty 2

## 2021-10-24 MED ORDER — SODIUM CHLORIDE 0.9% FLUSH: 3.0000 mL | Freq: Two times a day (BID) | INTRAVENOUS | Status: DC

## 2021-10-24 MED ORDER — SODIUM CHLORIDE 0.9 % WEIGHT BASED INFUSION: 1.0000 mL/kg/h | INTRAVENOUS | Status: DC

## 2021-10-24 MED ORDER — NITROGLYCERIN 0.4 MG SL SUBL: 0.4000 mg | SUBLINGUAL_TABLET | SUBLINGUAL | Status: DC | PRN

## 2021-10-24 MED ORDER — ONDANSETRON HCL 4 MG/2ML IJ SOLN: 4.0000 mg | Freq: Four times a day (QID) | INTRAMUSCULAR | Status: DC | PRN

## 2021-10-24 MED ORDER — LISINOPRIL 10 MG PO TABS: 10.0000 mg | ORAL_TABLET | Freq: Every day | ORAL | Status: DC

## 2021-10-24 MED ORDER — NITROGLYCERIN 1 MG/10 ML FOR IR/CATH LAB
INTRA_ARTERIAL | Status: AC
  Filled 2021-10-24: qty 10

## 2021-10-24 MED ORDER — LIDOCAINE HCL (PF) 1 % IJ SOLN
INTRAMUSCULAR | Status: DC | PRN
  Administered 2021-10-24: 2 mL

## 2021-10-24 MED ORDER — FENTANYL CITRATE (PF) 100 MCG/2ML IJ SOLN
INTRAMUSCULAR | Status: DC | PRN
  Administered 2021-10-24: 25 ug via INTRAVENOUS

## 2021-10-24 MED ORDER — NITROGLYCERIN 1 MG/10 ML FOR IR/CATH LAB
INTRA_ARTERIAL | Status: DC | PRN
  Administered 2021-10-24: 200 ug via INTRACORONARY

## 2021-10-24 MED ORDER — LABETALOL HCL 5 MG/ML IV SOLN: 10.0000 mg | INTRAVENOUS | Status: AC | PRN

## 2021-10-24 MED ORDER — HEPARIN SODIUM (PORCINE) 1000 UNIT/ML IJ SOLN
INTRAMUSCULAR | Status: AC
  Filled 2021-10-24: qty 10

## 2021-10-24 MED ORDER — HYDRALAZINE HCL 20 MG/ML IJ SOLN: 10.0000 mg | INTRAMUSCULAR | Status: AC | PRN

## 2021-10-24 MED ORDER — HEPARIN SODIUM (PORCINE) 1000 UNIT/ML IJ SOLN
INTRAMUSCULAR | Status: DC | PRN
  Administered 2021-10-24: 8000 [IU] via INTRAVENOUS

## 2021-10-24 MED ORDER — CLOPIDOGREL BISULFATE 75 MG PO TABS: 75.0000 mg | ORAL_TABLET | Freq: Every day | ORAL | Status: DC

## 2021-10-24 MED ORDER — MIDAZOLAM HCL 2 MG/2ML IJ SOLN
INTRAMUSCULAR | Status: AC
  Filled 2021-10-24: qty 2

## 2021-10-24 MED ORDER — HEPARIN (PORCINE) IN NACL 1000-0.9 UT/500ML-% IV SOLN
INTRAVENOUS | Status: DC | PRN
  Administered 2021-10-24 (×2): 500 mL

## 2021-10-24 MED ORDER — ACETAMINOPHEN 325 MG PO TABS: 650.0000 mg | ORAL_TABLET | ORAL | Status: DC | PRN

## 2021-10-24 MED ORDER — IOHEXOL 350 MG/ML SOLN
INTRAVENOUS | Status: DC | PRN
  Administered 2021-10-24: 92 mL

## 2021-10-24 MED ORDER — LIDOCAINE HCL (PF) 1 % IJ SOLN
INTRAMUSCULAR | Status: AC
  Filled 2021-10-24: qty 30

## 2021-10-24 MED ORDER — TAMSULOSIN HCL 0.4 MG PO CAPS: 0.4000 mg | ORAL_CAPSULE | Freq: Every day | ORAL | Status: DC

## 2021-10-24 MED ORDER — ATORVASTATIN CALCIUM 10 MG PO TABS: 20.0000 mg | ORAL_TABLET | Freq: Every day | ORAL | Status: DC

## 2021-10-24 MED ORDER — ASPIRIN 81 MG PO TBEC: 81.0000 mg | DELAYED_RELEASE_TABLET | Freq: Every day | ORAL | Status: DC

## 2021-10-24 MED ORDER — FENTANYL CITRATE (PF) 100 MCG/2ML IJ SOLN
INTRAMUSCULAR | Status: AC
  Filled 2021-10-24: qty 2

## 2021-10-24 MED ORDER — CARVEDILOL 3.125 MG PO TABS
3.1250 mg | ORAL_TABLET | Freq: Two times a day (BID) | ORAL | Status: DC
Start: 2021-10-24 — End: 2021-10-24

## 2021-10-24 SURGICAL SUPPLY — 19 items
BALLN EUPHORA RX 2.5X12 (BALLOONS) ×3
BALLN SAPPHIRE 3.0X12 (BALLOONS) ×3
BALLOON EUPHORA RX 2.5X12 (BALLOONS) IMPLANT
BALLOON SAPPHIRE 3.0X12 (BALLOONS) IMPLANT
CATH LAUNCHER 6FR EBU 3.5 SH (CATHETERS) ×2 IMPLANT
DEVICE RAD COMP TR BAND LRG (VASCULAR PRODUCTS) ×2 IMPLANT
GLIDESHEATH SLEND SS 6F .021 (SHEATH) ×2 IMPLANT
GUIDEWIRE INQWIRE 1.5J.035X260 (WIRE) IMPLANT
INQWIRE 1.5J .035X260CM (WIRE) ×3
KIT ENCORE 26 ADVANTAGE (KITS) ×2 IMPLANT
KIT ESSENTIALS PG (KITS) ×2 IMPLANT
KIT HEART LEFT (KITS) ×3 IMPLANT
PACK CARDIAC CATHETERIZATION (CUSTOM PROCEDURE TRAY) ×3 IMPLANT
STENT SYNERGY XD 3.0X12 (Permanent Stent) IMPLANT
SYNERGY XD 3.0X12 (Permanent Stent) ×3 IMPLANT
TRANSDUCER W/STOPCOCK (MISCELLANEOUS) ×3 IMPLANT
TUBING CIL FLEX 10 FLL-RA (TUBING) ×3 IMPLANT
WIRE ASAHI PROWATER 180CM (WIRE) ×2 IMPLANT
WIRE MAILMAN 182CM (WIRE) ×2 IMPLANT

## 2021-10-24 NOTE — Interval H&P Note (Signed)
History and Physical Interval Note:  10/24/2021 8:58 AM  Hughes Better  has presented today for surgery, with the diagnosis of cad.  The various methods of treatment have been discussed with the patient and family. After consideration of risks, benefits and other options for treatment, the patient has consented to  Procedure(s): CORONARY STENT INTERVENTION (N/A) as a surgical intervention.  The patient's history has been reviewed, patient examined, no change in status, stable for surgery.  I have reviewed the patient's chart and labs.  Questions were answered to the patient's satisfaction.   Cath Lab Visit (complete for each Cath Lab visit)  Clinical Evaluation Leading to the Procedure:   ACS: No.  Non-ACS:    Anginal Classification: CCS III  Anti-ischemic medical therapy: Minimal Therapy (1 class of medications)  Non-Invasive Test Results: No non-invasive testing performed  Prior CABG: Previous CABG        [facility] 10/24/2021 8:58 AM

## 2021-10-24 NOTE — Discharge Instructions (Signed)

## 2021-10-24 NOTE — Progress Notes (Signed)
CARDIAC REHAB PHASE I   Stent education completed with pt and family. Pt educated on importance of ASA and Plavix. Pt given stent card and heart healthy diet. Reviewed site care, restrictions, and exercise guidelines. Will refer to CRP II to meet the requirements, but pt not interested in attending.  [id number] Rufina Falco, RN BSN 10/24/2021 1:20 PM

## 2021-10-24 NOTE — Discharge Summary (Signed)
Discharge Summary for Same Day PCI   Patient ID: [name] MRN: [id number]; DOB: 03-24-1946  Admit date: 10/24/2021 Discharge date: 10/24/2021  Primary Care Provider: Pcp, No  Primary Cardiologist: None  Cardiology at the [facility] in [place] / Dr. Martinique  Primary Electrophysiologist:  None   Discharge Diagnoses    Principal Problem:   Angina pectoris [facility]) Active Problems:   Dyspnea on exertion   CAD of autologous bypass graft   HTN (hypertension)   Hypercholesterolemia  Diagnostic Studies/Procedures    Cardiac Catheterization 10/24/2021:  CORONARY STENT INTERVENTION   Conclusion      Ost Cx lesion is 40% stenosed.   Prox Cx lesion is 80% stenosed.   Mid Cx lesion is 40% stenosed.   A drug-eluting stent was successfully placed using a SYNERGY XD 3.0X12.   Post intervention, there is a 0% residual stenosis.   Successful PCI of the proximal LCx with DES   Plan: DAPT for 6 months. Anticipate same day DC.   Diagnostic Dominance: Right Intervention     History of Present Illness     [name] is a 76 y.o. male with CAD s/p CABG, HTN and HLD presents for outpatient cath.   He has a history of CABG in 2019 by Dr Clementeen Graham at [facility] with [place], LIMA to LIMA to the LAD and apparent free RIMA forming Y graft to the OM branch. 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   [facility] [place] [facility] on 08/31/2021, reportedly showing severe native CAD (40% left main, 50% proximal LAD, 75% mid LAD, 80% proximal LCx, 50% mid LCx, 70% proximal RCA, 80% mid RCA, 100% distal RCA, 80% right PDA), patent SVG to RCA, LIMA to LAD patent but Y RIMA to the OM occluded.   He was referred for consideration of PCI to proximal LCx and seen recently by Dr Gardiner Rhyme who referred to Dr. Martinique to review films.   He is dealing with exertional dyspnea. He complaints of short of breath recently,  particularly with walking up hills.  He typically golfs 3 days/week but has been limited by shortness of breath. He also reports having episodes of dizziness, especially when he walks. Intermittent chest pain.   He was started on Plavix with loading dose and cardiac catheterization was arranged for further evaluation.  Hospital Course     The patient underwent cardiac cath as noted above with successful PCI of the proximal LCx with DES. Plan for DAPT with ASA/Plavix for at least 5 months. The patient was seen by cardiac rehab while in short stay.  There were no observed complications post cath. Has mild ecchymosis but no palpable hematoma. Radial cath site was re-evaluated prior to discharge and found to be stable without any complications. Instructions/precautions regarding cath site care were given prior to discharge.  No results found for requested labs within last 8760 hours.   [name] was seen by Dr. Martinique and determined stable for discharge home. Follow up with our office has been arranged. Medications are listed below. Pertinent changes include None.  Cath/PCI Registry Performance & Quality Measures: Aspirin prescribed? - Yes ADP Receptor Inhibitor (Plavix/Clopidogrel, Brilinta/Ticagrelor or Effient/Prasugrel) prescribed (includes medically managed patients)? - Yes High Intensity Statin (Lipitor 40-80mg  or Crestor 20-40mg ) prescribed? - No - Consider outpatient titration   - Will bring medication for review during follow up.  Patient is unsure of dose.  For EF <40%, was ACEI/ARB prescribed? - Yes For EF <40%, Aldosterone Antagonist (Spironolactone or Eplerenone) prescribed? - Not Applicable (EF >/= 42%) Cardiac Rehab Phase II ordered (Included Medically managed Patients)? - Yes However patient is not interested   Discharge Vitals Blood pressure (!) 150/51, pulse (!) 50, temperature 97.9 F (36.6 C), temperature source Oral, resp. rate 19, height 5\' 9"  (1.753 m), weight 74.8 kg,  SpO2 100 %.  Filed Weights   10/24/21 0659  Weight: 74.8 kg    Last Labs & Radiologic Studies   CARDIAC CATHETERIZATION  Result Date: 10/24/2021   [place] Cx lesion is 40% stenosed.   Prox Cx lesion is 80% stenosed.   Mid Cx lesion is 40% stenosed.   A drug-eluting stent was successfully placed using a SYNERGY XD 3.0X12.   Post intervention, there is a 0% residual stenosis. Successful PCI of the proximal LCx with DES Plan: DAPT for 6 months. Anticipate same day DC.    Disposition   Pt is being discharged home today in good condition.  Follow-up Plans & Appointments     Follow-up Information     Martinique, Peter M, MD Follow up on 11/10/2021.   Specialty: Cardiology Why: @3 :40pm for cath follow up Contact information: [address] [place]  [address] [phone]                Discharge Instructions     Amb Referral to Cardiac Rehabilitation   Complete by: As directed    Diagnosis: Coronary Stents   After initial evaluation and assessments completed: Virtual Based Care may be provided alone or in conjunction with Phase 2 Cardiac Rehab based on patient barriers.: Yes      Discharge Medications   Allergies as of 10/24/2021       Reactions   Hydrochlorothiazide Other (See Comments)   Other reaction(s): Gout        Medication List     TAKE these medications    aspirin 81 MG EC tablet Take 81 mg by mouth daily. Notes to patient: 1/24   atorvastatin 20 MG tablet Commonly known as: LIPITOR Take 20 mg by mouth daily.   carvedilol 3.125 MG tablet Commonly known as: COREG Take 1 tablet (3.125 mg total) by mouth 2 (two) times daily.   clopidogrel 75 MG tablet Commonly known as: PLAVIX Take 1 tablet (75 mg total) by mouth daily. Notes to patient: 1/24   lisinopril 10 MG tablet Commonly known as: ZESTRIL Take 10 mg by mouth daily.   nitroGLYCERIN 0.4 MG SL tablet Commonly known as: NITROSTAT Place 1 tablet (0.4 mg total) under the tongue  every 5 (five) minutes as needed for chest pain.   tamsulosin 0.4 MG Caps capsule Commonly known as: FLOMAX Take 0.4 mg by mouth daily.           Allergies Allergies  Allergen Reactions   Hydrochlorothiazide Other (See Comments)    Other reaction(s): Gout    Outstanding Labs/Studies   None  Duration of Discharge Encounter   Greater than 30 minutes including physician time.  Jarrett Soho, PA 10/24/2021, 4:51 PM

## 2021-10-24 NOTE — Progress Notes (Signed)
Patient was given discharge instructions. He verbalized understanding. 

## 2021-10-25 ENCOUNTER — Telehealth: Payer: Self-pay | Admitting: Cardiology

## 2021-10-25 ENCOUNTER — Encounter (HOSPITAL_COMMUNITY): Payer: Self-pay | Admitting: Cardiology

## 2021-10-25 NOTE — Telephone Encounter (Signed)
Patient presented to office after heart cath on 10/24/21 - bleeding from right radial site Patient removed bandage and bleeding starting from cath site. He states the clot came off with bandage. He reports bleeding for 25 minutes before presenting to office  This writer and Belinda Block., RN assessed patient. He had a noticeable hematoma to right forearm and bruising midway up forearm. Firmness above radial cath site was noted. Cap refill less than 3 seconds, checked multiple times. Patient reports no changes in sensation in hand/fingers. Strong radial pulse (+2)  Patient had wrapped his cath site with a bandaid on site and then cobain   Patient was advised by RN(s) and Martinique MD to monitor for bleeding and hold pressure if this is noticeable; if he notices firmness above the cath site, to hold pressure. He was advised to go to ED if bleeding resume, persisted despite above measures taken.   RN(s) reinforced pressure dressing that was already applied by patient.   When patient left office, site had no oozing. Cap refill good. No change in sensation. Good radial pulse. Provided patient with cobain, tape, non-adherent pad, gauze in the event he needs to rebandage site.

## 2021-11-06 NOTE — Progress Notes (Signed)
Cardiology Office Note   Date:  11/10/2021   ID:  [name], DOB 02/17/1946, MRN [id number]  PCP:  Pcp, No  Cardiologist:   Misty Foutz Martinique, MD   Chief Complaint  Patient presents with   Follow-up   Coronary Artery Disease      History of Present Illness: [name] is a 76 y.o. male who is seen at the request of Dr Gardiner Rhyme for consideration of PCI. He has a hx of CAD, hypertension, colon cancer.  He has a history of CABG in 2019 by Dr Clementeen Graham at [facility] with [place], LIMA to LIMA to the LAD and apparent free RIMA forming Y graft to the OM branch. 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  He presented with complaints of short of breath recently, particularly with walking up hills.  He typically golfs 3 days/week but has been limited by shortness of breath. He also reports having episodes of dizziness, especially when he walks.  Denies any syncopal episodes.  Reports has had intermittent chest pain as well.  Describes as left-sided chest pain.  [facility] [place] [facility] cath on 08/31/2021, reportedly showing severe native CAD (40% left main, 50% proximal LAD, 75% mid LAD, 80% proximal LCx, 50% mid LCx, 70% proximal RCA, 80% mid RCA, 100% distal RCA, 80% right PDA), patent SVG to RCA, LIMA to LAD patent but Y RIMA to the OM occluded.   He was referred for consideration of PCI to proximal LCx and seen recently by Dr Gardiner Rhyme who asked me to review films.   He reports that he gets SOB with activity just walking down his driveway. Previously able to walk and play golf without difficulty. Notes fatigue. Some CP with inspiration. Notes symptoms similar to what he had prior to CABG. No bleeding history. Notes he had recent CT of carotids and this was OK.   He did undergo successful PCI of the LCx on 10/24/21 with DES. Had a lot of bruising in his arm afterwards. This has healed well. He now notes a marked  improvement in his breathing. Able to walk strenuously now and play golf without any limitations.     Past Medical History:  Diagnosis Date   Cancer [facility])    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   Hypertension     Past Surgical History:  Procedure Laterality Date   CORONARY STENT INTERVENTION N/A 10/24/2021   Procedure: CORONARY STENT INTERVENTION;  Surgeon: Martinique, Feliciano Wynter M, MD;  Location: [place] [facility];  Service: Cardiovascular;  Laterality: N/A;   HIP ARTHROPLASTY     BILAT   NECK MASS EXCISION     RT SIDE LYMPH NODE REMOVED   SHOULDER ARTHROSCOPY WITH SUBACROMIAL DECOMPRESSION AND OPEN ROTATOR C Left 08/13/2015   Procedure: LEFT SHOULDER ARTHROSCOPY WITH SUBACROMIAL DECOMPRESSION AND MINI OPEN ROTATOR CUFF REPAIR, BICEPS TENODESIS AND OPEN DISTAL CLAVICLE RESECTION;  Surgeon: Netta Cedars, MD;  Location: [place];  Service: Orthopedics;  Laterality: Left;   TONSILLECTOMY     WRIST SURGERY     LEFT     Current Outpatient Medications  Medication Sig Dispense Refill   aspirin 81 MG EC tablet Take 81 mg by mouth daily.     atorvastatin (LIPITOR) 20 MG tablet Take 20 mg by mouth daily.     carvedilol (COREG) 3.125 MG tablet Take 1 tablet (3.125 mg total) by mouth 2 (two) times daily. [place]  tablet 3   clopidogrel (PLAVIX) 75 MG tablet Take 1 tablet (75 mg total) by mouth daily. 90 tablet 3   lisinopril (PRINIVIL,ZESTRIL) 10 MG tablet Take 10 mg by mouth daily.     nitroGLYCERIN (NITROSTAT) 0.4 MG SL tablet Place 1 tablet (0.4 mg total) under the tongue every 5 (five) minutes as needed for chest pain. 25 tablet 6   tamsulosin (FLOMAX) 0.4 MG CAPS capsule Take 0.4 mg by mouth daily.     Current Facility-Administered Medications  Medication Dose Route Frequency Provider Last Rate Last Admin   sodium chloride flush (NS) 0.9 % injection 3 mL  3 mL Intravenous Q12H Martinique, Richrd Kuzniar M, MD        Allergies:   Hydrochlorothiazide    Social History:  The patient  reports that he has never  smoked. He has never used smokeless tobacco. He reports that he does not drink alcohol and does not use drugs.   Family History:  The patient's is negative for premature CAD.   ROS:  Please see the history of present illness.   Otherwise, review of systems are positive for none.   All other systems are reviewed and negative.    PHYSICAL EXAM: VS:  BP (!) 124/58 (BP Location: Left Arm, Patient Position: Sitting, Cuff Size: Normal)    Pulse 60    Ht 6' (1.829 m)    Wt 166 lb (75.3 kg)    BMI 22.51 kg/m  , BMI Body mass index is 22.51 kg/m. GEN: Well nourished, well developed, in no acute distress HEENT: normal Neck: no JVD, carotid bruits, or masses Cardiac: RRR; no murmurs, rubs, or gallops,no edema  Respiratory:  clear to auscultation bilaterally, normal work of breathing GI: soft, nontender, nondistended, + BS MS: no deformity or atrophy. Minimal bruising at cath site. Skin: warm and dry, no rash Neuro:  Strength and sensation are intact Psych: euthymic mood, full affect   EKG:  EKG is not ordered today. The ekg ordered today demonstrates N/A   Recent Labs: 10/19/2021: BUN 23; Creatinine, Ser 1.22; Hemoglobin 13.0; Platelets 118; Potassium 4.6; Sodium 144    Lipid Panel No results found for: CHOL, TRIG, HDL, CHOLHDL, VLDL, LDLCALC, LDLDIRECT    Wt Readings from Last 3 Encounters:  11/10/21 166 lb (75.3 kg)  10/24/21 165 lb (74.8 kg)  10/19/21 165 lb (74.8 kg)      Other studies Reviewed: Additional studies/ records that were reviewed today include:  CORONARY STENT INTERVENTION   Conclusion      Ost Cx lesion is 40% stenosed.   Prox Cx lesion is 80% stenosed.   Mid Cx lesion is 40% stenosed.   A drug-eluting stent was successfully placed using a SYNERGY XD 3.0X12.   Post intervention, there is a 0% residual stenosis.   Successful PCI of the proximal LCx with DES   Plan: DAPT for 6 months. Anticipate same day DC.   Diagnostic Dominance:  Right Intervention     ASSESSMENT AND PLAN:  1.  CAD: s/p CABG in 2019 with SVG-RCA, LIMA to  LAD and Free RIMA Y graft to OM branch. LIMA to the LAD is patent as is SVG to RCA. No visualization of Y graft to OM. The native LCx demonstrates a 80% stenosis proximally. There is modest calcification. I also reviewed his Echo images. This showed normal LV function with EF 60%. There is mild TR and mild biatrial enlargement. No effusion.  Now s/p stenting of native LCx with marked improvement  in symptoms.  -Continue aspirin, statin -on low dose Coreg -As needed sublingual nitroglycerin - now s/p PCI of the LCx on 10/24/21 - plan to continue Plavix for 6 months.   2. Hypertension: On lisinopril 10 mg daily, carvedilol 3.125 mg twice daily.  controlled.   3. Hyperlipidemia: Continue atorvastatin 40 mg daily        Disposition:   OV in 6 months   Signed, Kenzli Barritt Martinique, MD  11/10/2021 3:34 PM    [facility] [address] [place], [place], [address] Phone [phone], Fax [phone]  d

## 2021-11-10 ENCOUNTER — Encounter: Payer: Self-pay | Admitting: Cardiology

## 2021-11-10 ENCOUNTER — Ambulatory Visit (INDEPENDENT_AMBULATORY_CARE_PROVIDER_SITE_OTHER): Payer: No Typology Code available for payment source | Admitting: Cardiology

## 2021-11-10 ENCOUNTER — Other Ambulatory Visit: Payer: Self-pay

## 2021-11-10 VITALS — BP 124/58 | HR 60 | Ht 72.0 in | Wt 166.0 lb

## 2021-11-10 DIAGNOSIS — Z955 Presence of coronary angioplasty implant and graft: Secondary | ICD-10-CM

## 2021-11-10 DIAGNOSIS — I209 Angina pectoris, unspecified: Secondary | ICD-10-CM

## 2021-11-10 DIAGNOSIS — E785 Hyperlipidemia, unspecified: Secondary | ICD-10-CM | POA: Diagnosis not present

## 2021-11-10 DIAGNOSIS — I25118 Atherosclerotic heart disease of native coronary artery with other forms of angina pectoris: Secondary | ICD-10-CM | POA: Diagnosis not present

## 2022-01-04 ENCOUNTER — Other Ambulatory Visit (HOSPITAL_COMMUNITY): Payer: Self-pay | Admitting: Otolaryngology

## 2022-01-04 ENCOUNTER — Other Ambulatory Visit: Payer: Self-pay | Admitting: Otolaryngology

## 2022-01-04 DIAGNOSIS — R0989 Other specified symptoms and signs involving the circulatory and respiratory systems: Secondary | ICD-10-CM

## 2022-01-09 ENCOUNTER — Ambulatory Visit (HOSPITAL_BASED_OUTPATIENT_CLINIC_OR_DEPARTMENT_OTHER)
Admission: RE | Admit: 2022-01-09 | Discharge: 2022-01-09 | Disposition: A | Discharge status: Home / Self Care | Payer: No Typology Code available for payment source | Source: Ambulatory Visit | Attending: Otolaryngology | Admitting: Otolaryngology

## 2022-01-09 ENCOUNTER — Encounter (HOSPITAL_BASED_OUTPATIENT_CLINIC_OR_DEPARTMENT_OTHER): Payer: Self-pay

## 2022-01-09 DIAGNOSIS — R0989 Other specified symptoms and signs involving the circulatory and respiratory systems: Secondary | ICD-10-CM | POA: Insufficient documentation

## 2022-01-09 LAB — POCT I-STAT CREATININE: Creatinine, Ser: 1.5 mg/dL — ABNORMAL HIGH (ref 0.61–1.24)

## 2022-01-09 MED ORDER — IOHEXOL 300 MG/ML  SOLN
100.0000 mL | Freq: Once | INTRAMUSCULAR | Status: AC | PRN
  Administered 2022-01-09: 60 mL via INTRAVENOUS

## 2022-01-11 ENCOUNTER — Ambulatory Visit (HOSPITAL_BASED_OUTPATIENT_CLINIC_OR_DEPARTMENT_OTHER): Payer: No Typology Code available for payment source

## 2022-01-27 ENCOUNTER — Encounter: Payer: Self-pay | Admitting: Plastic Surgery

## 2022-01-27 ENCOUNTER — Ambulatory Visit (INDEPENDENT_AMBULATORY_CARE_PROVIDER_SITE_OTHER): Payer: No Typology Code available for payment source | Admitting: Plastic Surgery

## 2022-01-27 VITALS — BP 140/62 | HR 51 | Ht 71.0 in | Wt 160.0 lb

## 2022-01-27 DIAGNOSIS — H02125 Mechanical ectropion of left lower eyelid: Secondary | ICD-10-CM

## 2022-01-27 DIAGNOSIS — H02403 Unspecified ptosis of bilateral eyelids: Secondary | ICD-10-CM

## 2022-01-27 DIAGNOSIS — H02122 Mechanical ectropion of right lower eyelid: Secondary | ICD-10-CM

## 2022-01-27 NOTE — Progress Notes (Addendum)
? ?Referring Provider ?[name], [place] ?[name] ?[name],  [name] [id number]  ? ?CC:  ?Left eyelid ptosis, ectropion ? ? ?[name] is an 76 y.o. male.  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  He also has bilateral ectropion.  He has not had a visual field test.  He had a benign corneal tumor removed.  Patient's history is also notable for coronary artery disease and is currently on Plavix. ? ?Allergies  ?Allergen Reactions  ? Hydrochlorothiazide Other (See Comments)  ?  Other reaction(s): Gout  ? ? ?Outpatient Encounter Medications as of 01/27/2022  ?Medication Sig Note  ? aspirin 81 MG EC tablet Take 81 mg by mouth daily.   ? atorvastatin (LIPITOR) 20 MG tablet Take 20 mg by mouth daily.   ? carvedilol (COREG) 3.125 MG tablet Take 1 tablet (3.125 mg total) by mouth 2 (two) times daily.   ? clopidogrel (PLAVIX) 75 MG tablet Take 1 tablet (75 mg total) by mouth daily.   ? lisinopril (PRINIVIL,ZESTRIL) 10 MG tablet Take 10 mg by mouth daily.   ? tamsulosin (FLOMAX) 0.4 MG CAPS capsule Take 0.4 mg by mouth daily.   ? nitroGLYCERIN (NITROSTAT) 0.4 MG SL tablet Place 1 tablet (0.4 mg total) under the tongue every 5 (five) minutes as needed for chest pain. 10/19/2021: New Med   ? ?Facility-Administered Encounter Medications as of 01/27/2022  ?Medication  ? sodium chloride flush (NS) 0.9 % injection 3 mL  ?  ? ?Past Medical History:  ?Diagnosis Date  ? Cancer [facility])   ? 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  ? Hypertension   ? ? ?Past Surgical History:  ?Procedure Laterality Date  ? CORONARY STENT INTERVENTION N/A 10/24/2021  ? Procedure: CORONARY STENT INTERVENTION;  Surgeon: Martinique, Peter M, MD;  Location: [place] [facility];  Service: Cardiovascular;  Laterality: N/A;  ? HIP ARTHROPLASTY    ? BILAT  ? NECK MASS EXCISION    ? RT SIDE LYMPH NODE REMOVED  ? SHOULDER ARTHROSCOPY WITH SUBACROMIAL DECOMPRESSION AND OPEN ROTATOR C Left 08/13/2015  ? Procedure: LEFT SHOULDER ARTHROSCOPY WITH SUBACROMIAL DECOMPRESSION  AND MINI OPEN ROTATOR CUFF REPAIR, BICEPS TENODESIS AND OPEN DISTAL CLAVICLE RESECTION;  Surgeon: Netta Cedars, MD;  Location: [place];  Service: Orthopedics;  Laterality: Left;  ? TONSILLECTOMY    ? WRIST SURGERY    ? LEFT  ? ? ?No family history on file. ? ?Social History  ? ?Social History Narrative  ? Left handed  ? One story home   ? Drinks no cafffeine  ?  ? ?Review of Systems ?General: Denies fevers, chills, weight loss ?CV: Denies chest pain, shortness of breath, palpitations ? ? ?Physical Exam ? ?  01/27/2022  ? 10:37 AM 11/10/2021  ?  3:18 PM 10/24/2021  ?  3:10 PM  ?Vitals with BMI  ?Height '5\' 11"'$  '6\' 0"'$    ?Weight 160 lbs 166 lbs   ?BMI 22.33 22.51   ?Systolic 998 338 250  ?Diastolic 62 58 48  ?Pulse 51 60 51  ?  ?General:  No acute distress,  Alert and oriented, Non-Toxic, Normal speech and affect ?HEENT: Bilateral eyelid ectropion, dermatochalasis bilaterally, eyelid ptosis significant on the left MRD on the right is 3 and MRD on the left is 0.5 mm ? ?Assessment/Plan ?Patient is a possible candidate for left upper eyelid ptosis repair.  Would likely perform blepharoplasty on the right side for symmetry.  We did determine whether anything would be done with the  levator on the right side after examining the patient again.  He also does have ectropion and this could be repaired in the future.  We need to figure out whether he can come off of Plavix or hold this for a period of time.  We will see him back after visual field test and give recommendations about his Plavix. ? ?[name] ?01/27/2022, 12:01 PM  ? ?02/08/22 ?Patient's VF test reviewed and show significant visual obstruction that is improved by greater than 20 degrees with eyelid taping. ?  ?

## 2022-02-08 ENCOUNTER — Telehealth: Payer: Self-pay | Admitting: Plastic Surgery

## 2022-02-08 NOTE — Telephone Encounter (Signed)
Returned patient's call regarding surgery. I advised patient that I have not received the visual field results yet. Patient said he dropped them off last week and we should have the authorization on file from insurance. Will follow up with provider and advised patient I will reach back out to him.  ?

## 2022-02-10 ENCOUNTER — Telehealth: Payer: Self-pay | Admitting: Cardiology

## 2022-02-10 ENCOUNTER — Telehealth: Payer: Self-pay | Admitting: Plastic Surgery

## 2022-02-10 NOTE — Telephone Encounter (Signed)
Pt calling to f/u on clearance that was sent over for an eye procedure that he is having. Pt states that information was sent asking if pt is able to stop Plavix. Please advise ?

## 2022-02-10 NOTE — Telephone Encounter (Signed)
Returned patient's call about scheduling surgery. Advised him that we are waiting on clarification on whether he needs to be off his plavix for 7 days or 6 months. I advised that as soon as we have that information, I can call him to schedule surgery. We will have clinical team to follow up on request early next week if no response.  ?

## 2022-02-10 NOTE — Telephone Encounter (Signed)
-  Pt called to check on surgical clearance from [facility]. ?-Nurse informed pt we have not received clearance forms  ?-Nurse contacted surgeons office and provided fax number.  ?

## 2022-02-13 ENCOUNTER — Telehealth: Payer: Self-pay

## 2022-02-13 NOTE — Telephone Encounter (Signed)
? ?  Patient Name: [name]  ?DOB: 05-May-1946 ?MRN: [id number] ? ?Primary Cardiologist: Peter Martinique, MD ? ?Chart reviewed as part of pre-operative protocol coverage.  ? ?Patient is post PCI on 10/24/2021.  Per Dr. Doug Sou recommendation, patient is to continue DAPT with aspirin and Plavix uninterrupted for 6 months. Therefore, it is not recommended that patient hold aspirin or Plavix for elective procedure prior to 04/23/2022.The patient has an upcoming visit scheduled with Dr. Martinique on 05/09/2022 at which time clearance can be addressed in case there are any issues that would impact surgical recommendations. ? ?Date of left eyelid blepharoplasty with ptosis repair and right eyelid blepharoplasty TBD.  I will add preop FYI to appointment notes that provider is aware to address at time of outpatient visit.  Per office protocol the cardiology provider should for their finalize transition and recommendations regarding antiplatelet therapy to the requesting party below. ? ?I will route this message as FYI to requesting party) from the preop box a separate preop APP input not needed at this time. Please call with any questions. ? ?Lenna Sciara, NP ?02/13/2022, 4:01 PM ? ? ?

## 2022-02-13 NOTE — Telephone Encounter (Signed)
? ?  Pre-operative Risk Assessment  ?  ?Patient Name: [name]  ?DOB: [birth year] ?MRN: [id number]  ? ?  ? ?Request for Surgical Clearance   ? ?Procedure:   Left eyelid blepharoplasty with ptosis repair and right eyelid blepharoplasty ? ?Date of Surgery:  Clearance TBD                              ?   ?Surgeon:  Dr.Daniel Luppens ?Surgeon's Group or Practice Name:  [facility] ?Phone number:  [phone] ?Fax number:  [phone] ?  ?Type of Clearance Requested:   ?- Medical  ?- Pharmacy:  Hold Aspirin and Clopidogrel (Plavix)   ?  ?Type of Anesthesia:  Not Indicated ?  ?Additional requests/questions:  Please advise surgeon/provider what medications should be held. ?Please fax a copy of clearance to the surgeon's office. ? ?Signed, ?Thedore Mins Dannya Pitkin   ?02/13/2022, 11:43 AM  ? ?

## 2022-02-13 NOTE — Telephone Encounter (Signed)
Notes have been faxed to requested office; see notes from pre op provider and recommendations from cardiologist.  ?

## 2022-02-14 ENCOUNTER — Telehealth: Payer: Self-pay

## 2022-02-14 NOTE — Telephone Encounter (Signed)
Spoke with patient, advised he has to wait until he sees his cardiologist on 05/09/2022 to be cleared for his surgery.  ?

## 2022-03-02 ENCOUNTER — Ambulatory Visit: Payer: No Typology Code available for payment source | Admitting: Nurse Practitioner

## 2022-03-06 ENCOUNTER — Ambulatory Visit (INDEPENDENT_AMBULATORY_CARE_PROVIDER_SITE_OTHER): Payer: No Typology Code available for payment source | Admitting: Plastic Surgery

## 2022-03-06 VITALS — BP 118/68 | HR 59 | Temp 97.5°F | Ht 72.0 in | Wt 165.0 lb

## 2022-03-06 DIAGNOSIS — H02403 Unspecified ptosis of bilateral eyelids: Secondary | ICD-10-CM

## 2022-03-06 NOTE — Progress Notes (Unsigned)
Patient ID: [name], male    DOB: 20-Oct-1945, 76 y.o.   MRN: [id number]    Chief complaint: Eyelid ptosis   History of Present Illness: [name] is a 76 y.o.  male  with a history of eyelid ptosis left greater than right.  He presents for preoperative evaluation for upcoming procedure, left-sided blepharoplasty with levator advancement and right-sided blepharoplasty with possible levator advancement., scheduled for 03/20/22 with Dr.  Erin Hearing .  Patient has discussed with cardiology holding Plavix for 1 week.  The patient has not had problems with anesthesia.     PMH Significant for: CAD   Past Medical History: Allergies: Allergies  Allergen Reactions   Hydrochlorothiazide Other (See Comments)    Other reaction(s): Gout    Current Medications:  Current Outpatient Medications:    aspirin 81 MG EC tablet, Take 81 mg by mouth daily., Disp: , Rfl:    atorvastatin (LIPITOR) 20 MG tablet, Take 20 mg by mouth daily., Disp: , Rfl:    carvedilol (COREG) 3.125 MG tablet, Take 1 tablet (3.125 mg total) by mouth 2 (two) times daily., Disp: 180 tablet, Rfl: 3   clopidogrel (PLAVIX) 75 MG tablet, Take 1 tablet (75 mg total) by mouth daily., Disp: 90 tablet, Rfl: 3   lisinopril (PRINIVIL,ZESTRIL) 10 MG tablet, Take 10 mg by mouth daily., Disp: , Rfl:    tamsulosin (FLOMAX) 0.4 MG CAPS capsule, Take 0.4 mg by mouth daily., Disp: , Rfl:    nitroGLYCERIN (NITROSTAT) 0.4 MG SL tablet, Place 1 tablet (0.4 mg total) under the tongue every 5 (five) minutes as needed for chest pain., Disp: 25 tablet, Rfl: 6  Current Facility-Administered Medications:    sodium chloride flush (NS) 0.9 % injection 3 mL, 3 mL, Intravenous, Q12H, Martinique, Peter M, MD  Past Medical Problems: Past Medical History:  Diagnosis Date   Cancer [facility])    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   Hypertension     Past Surgical History: Past Surgical History:  Procedure Laterality Date   CORONARY STENT  INTERVENTION N/A 10/24/2021   Procedure: CORONARY STENT INTERVENTION;  Surgeon: Martinique, Peter M, MD;  Location: [place] [facility];  Service: Cardiovascular;  Laterality: N/A;   HIP ARTHROPLASTY     BILAT   NECK MASS EXCISION     RT SIDE LYMPH NODE REMOVED   SHOULDER ARTHROSCOPY WITH SUBACROMIAL DECOMPRESSION AND OPEN ROTATOR C Left 08/13/2015   Procedure: LEFT SHOULDER ARTHROSCOPY WITH SUBACROMIAL DECOMPRESSION AND MINI OPEN ROTATOR CUFF REPAIR, BICEPS TENODESIS AND OPEN DISTAL CLAVICLE RESECTION;  Surgeon: Netta Cedars, MD;  Location: [place];  Service: Orthopedics;  Laterality: Left;   TONSILLECTOMY     WRIST SURGERY     LEFT    Social History: Social History   Socioeconomic History   Marital status: Single    Spouse name: Not on file   Number of children: Not on file   Years of education: Not on file   Highest education level: Not on file  Occupational History   Not on file  Tobacco Use   Smoking status: Never   Smokeless tobacco: Never  Substance and Sexual Activity   Alcohol use: No   Drug use: No   Sexual activity: Not on file  Other Topics Concern   Not on file  Social History Narrative   Left handed   One story home    Drinks no cafffeine   Social Determinants of Health   Financial Resource Strain: Not  on file  Food Insecurity: Not on file  Transportation Needs: Not on file  Physical Activity: Not on file  Stress: Not on file  Social Connections: Not on file  Intimate Partner Violence: Not on file    Family History: No family history on file.  Review of Systems: ROS  Physical Exam: Vital Signs BP 118/68 (BP Location: Left Arm, Patient Position: Sitting, Cuff Size: Normal)   Pulse (!) 59   Temp (!) 97.5 F (36.4 C) (Oral)   Ht 6' (1.829 m)   Wt 165 lb (74.8 kg)   BMI 22.38 kg/m   Physical Exam  Constitutional:      General: Not in acute distress.    Appearance: Normal appearance. Not ill-appearing.  HENT:     Head: Normocephalic and  atraumatic.  Eyes:     Pupils: Pupils are equal, round Neck:     Musculoskeletal: Normal range of motion.  Cardiovascular:     Rate and Rhythm: Normal rate    Pulses: Normal pulses.  Pulmonary:     Effort: Pulmonary effort is normal. No respiratory distress.  Abdominal:     General: Abdomen is flat. There is no distension.  Musculoskeletal: Normal range of motion.  Skin:    General: Skin is warm and dry.     Findings: No erythema or rash.  Neurological:     General: No focal deficit present.     Mental Status: Alert and oriented to person, place, and time. Mental status is at baseline.     Motor: No weakness.  Psychiatric:        Mood and Affect: Mood normal.        Behavior: Behavior normal.    Assessment/Plan: The patient is scheduled for bilateral upper eyelid surgery with Dr.  Erin Hearing .  Risks, benefits, and alternatives of procedure discussed, questions answered and consent obtained.    Smoking Status: none; Counseling Given? yes   Caprini Score: 5; Recommendation for mechanical prophylaxis. Encourage early ambulation.   Pictures obtained: previous  Post-op Rx sent to pharmacy: oxycodone, zofran  Patient was provided with the  General Surgical Risk consent document and Pain Medication Agreement prior to their appointment.  They had adequate time to read through the risk consent documents and Pain Medication Agreement. We also discussed them in person together during this preop appointment. All of their questions were answered to their satisfaction.  Recommended calling if they have any further questions.  Risk consent form and Pain Medication Agreement to be scanned into patient's chart.     Electronically signed by: Lennice Sites, MD 03/06/2022 3:53 PM

## 2022-03-07 MED ORDER — OXYCODONE HCL 5 MG PO TABS: 5.0000 mg | ORAL_TABLET | ORAL | 0 refills | Status: AC | PRN

## 2022-03-07 MED ORDER — ONDANSETRON 4 MG PO TBDP: 4.0000 mg | ORAL_TABLET | Freq: Three times a day (TID) | ORAL | 0 refills | Status: AC | PRN

## 2022-03-17 ENCOUNTER — Telehealth: Payer: Self-pay | Admitting: Plastic Surgery

## 2022-03-17 ENCOUNTER — Encounter (HOSPITAL_COMMUNITY): Payer: Self-pay | Admitting: Plastic Surgery

## 2022-03-17 NOTE — Telephone Encounter (Signed)
Pt left voicemail regarding his surgery being cancelled. Patient was very upset at the fact his surgery has been cancelled at the last minute and did advise me that he is trying to get into [place] to have the surgery done but he asked me to look for a date to reschedule him to and if he could get in to [facility] sooner then he would, but if not at least he would have the date rescheduled.   Called patient back approximately 10 minutes later and left him a voicemail that I have moved his surgery to August 2nd at 11:45 with arrival time of 9:45 am. Advised him that I had spoken to both the PA with the Anesthesiologist dept and Dr. Erin Hearing and confirmed the first week of August should be okay and both agreed. Advised patient to call me back and let me know what he decides regarding new surgery date and that we would need to reschedule his pre and post op appointments to coincide with new surgery date.

## 2022-03-17 NOTE — Progress Notes (Signed)
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  History includes never smoker, CAD/TAA (s/p  CABG 01/18/18: SVG-PDA, free RIMA-OM1-LAD with inflow from the LIMA as a Y graft, replacement of ascending aorta and hemi arch using 24 mm Gelweave vascular graft; DES LCX 10/24/21; post-op AF, resolved), HTN, colon cancer (s/p transanal resection, chemoradiation ~ 2006), right parotid SCC cancer (s/p resection ~ 2012), THA (right 03/05/07; left 04/26/07).  His primary cardiologist is Dr. Conrad Ripon with [facility], although Dr. Peter Martinique with [facility] performed 10/24/21 PCI. Diona Browner, NP with Dr. Martinique and recommended that Plavix should not be interrupted for elective surgery prior to 04/23/22. He was evaluated by Dr. Marvel Plan last on 02/15/22. Notes suggests he would clear for him to hold Plavix after 5 months for eye surgery if he would stay on ASA (which Dr. Erin Hearing was willing to do), and after six months would stop ASA and have him stay on Plavix indefinitely. Handwritten notation by by Dr. Marvel Plan (scanned under Media tab) indicated that  Mr. Card could hold Plavix for 5-7 days after 03/12/22 and resume after surgery.   Discussed with anesthesiologist Dr. Christella Hartigan, as well as several other anesthesiologists. Patient is less than six months (even just shy of 5 months) from having a DES placed and is scheduled for an elective procedure, so they would advise postponing surgery until six months after DES placement. This is consistent with current ACC/AHA Guidelines that  indicate "Elective noncardiac surgery should be delayed 30 days after BMS implantation and optimally 6 months after DES implantation", as well as input from the [facility] cardiology team as outlined in 02/13/22 notation by Diona Browner, NP. I have discussed this with Dr. Erin Hearing, and I called and spoke with [name]. [name] was understandable upset at the late notice that his surgery would need to be postponed and because he had been given permission to hold Plavix after 03/12/22 from his primary cardiologist Dr. Marvel Plan. He may consider trying to have surgery elsewhere. I have updated Dr. Erin Hearing, and his scheduler will reach out and see if he is willing to reschedule surgery after 04/30/22.   VS:  BP Readings from Last 3 Encounters:  03/06/22 118/68  01/27/22 140/62  11/10/21 (!) 124/58   Pulse Readings from Last 3 Encounters:  03/06/22 (!) 59  01/27/22 (!) 51  11/10/21 60     PROVIDERS: [facility] is where he received primary care.   Conrad , MD is cardiologist at [facility]. He was seen at [facility] by Oswaldo Milian, MD and Martinique, Peter, MD in 10/2021 when considering PCI which was done on 10/24/21. Last visit with Dr. Marvel Plan was on 02/15/22 with one year follow-up recommended.    Raynelle Jan, MD is vascular surgeon [facility]. Last visit 12/15/21 for mild, asymptomatic carotid stenosis. Anticipate one year follow-up.  Silvestre Moment, MD is ENT   LABS: As of 01/30/22 [facility]), Creatinine 1.28, BUN 29, eGFR 58, glucose 90, HGB 13.7, HCT 40.8, PLT 147, AST 21, ALT 29, A1c 5.9%, TSH 2.79.   IMAGES: CT Soft Tissue Neck  01/09/22: IMPRESSION: - No pathologically enlarged lymph node is identified within the neck. - Nonspecific 2.9 cm focus of skin thickening and subcutaneous induration/stranding within the right perimandibular soft tissues. Direct visualization is recommended in this patient with a reported history of skin cancer. - Multiple surgical clips  within the right neck. Correlate with the operative history. - Atherosclerotic plaque within the carotid bifurcations and proximal internal carotid arteries, which may result in hemodynamically significant stenoses, bilaterally. A carotid artery duplex is recommended for further evaluation. - Aortic Atherosclerosis (ICD10-I70.0). - Cervical spondylosis.    EKG: 10/24/21: SB at 51 bpm   CV: PCI 10/24/21:   Ost Cx lesion is 40% stenosed.   Prox Cx lesion is 80% stenosed.   Mid Cx lesion is 40% stenosed.   A drug-eluting stent was successfully placed using a SYNERGY XD 3.0X12.   Post intervention, there is a 0% residual stenosis.   Successful PCI of the proximal LCx with DES Plan: DAPT for 6 months. Anticipate same day DC.    US Carotid 10/04/21 ([facility]): IMPRESSION:  1.  No hemodynamically significant stenosis on either side.    2.  Both vertebral arteries are patent with antegrade flow.     Zio cardia monitor 11/2020 (as outlined in 12/09/21 [facility] cardiology note): Impression: Underlying rhythm is sinus with normal heart rate variability No atrial fibrillation or flutter No high-grade AV block or prolonged Brief episodes of nonsustained VT and SVT as detailed above, asymptomatic Triggered events correspond to sinus rhythm   ECHO 10/31/20 (as outlined in 12/09/21 [facility] cardiology note): Interpretation Summary Left ventricular systolic function is normal. EF= 70% by 2D biplane method. No regional wall motion abnormalities noted. Grade I diastolic dysfunction, (abnormal relaxation pattern). There is borderline concentric left ventricular hypertrophy. No hemodynamically significant valvular aortic stenosis. There is trace mitral regurgitation. There is mild tricuspid regurgitation. Right ventricular systolic pressure is estimated at 25 mmHg. The proximal ascending aorta is not well visualized. There is no comparison study available.   LEFT HEART CATHETERIZATION 08/31/21  ([place]): Heart Rate: 51 Pressures (mmHg) Aorta: 974/ 60 LV Systolic: 163, EDP: 21  CORONARY ANGIOGRAPHY Native Vessels Summary: 3 vessel CAD Dominance: Right dominant  Stenoses Details Left Main 40%  distal Proximal LAD 50%  Mid LAD 75%  Proximal Circumflex 80%  Mid Circumflex 50%  Proximal RCA 70%  Mid RCA 80%  Distal RCA 100%  Right PDA 80%  ostial  BYPASS GRAFT ANGIOGRAPHY 1. SVG 40% Distal RCA  proximal 2. Pedicle LIMA Mid LAD  Distal LIMA inserts into the free RIMA; one end of the [place] goes to OM1 and the other end to  the LAD. The segment to OM1 is occluded with some retrograde flow; the segment to LAD is patent.   Past Medical History:  Diagnosis Date   Cancer [facility])    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   Coronary artery disease    Hypertension     Past Surgical History:  Procedure Laterality Date   CORONARY ARTERY BYPASS GRAFT  01/18/2018   CABG 01/18/18: SVG-PDA, free RIMA-OM1-LAD with inflow from the LIMA as a Y graft, replacement of ascending aorta and hemi arch using 24 mm Gelweave vascular graft   CORONARY STENT INTERVENTION N/A 10/24/2021   Procedure: CORONARY STENT INTERVENTION;  Surgeon: Martinique, Peter M, MD;  Location: [place] [facility];  Service: Cardiovascular;  Laterality: N/A;   HIP ARTHROPLASTY     BILAT   NECK MASS EXCISION  RT SIDE LYMPH NODE REMOVED   SHOULDER ARTHROSCOPY WITH SUBACROMIAL DECOMPRESSION AND OPEN ROTATOR C Left 08/13/2015   Procedure: LEFT SHOULDER ARTHROSCOPY WITH SUBACROMIAL DECOMPRESSION AND MINI OPEN ROTATOR CUFF REPAIR, BICEPS TENODESIS AND OPEN DISTAL CLAVICLE RESECTION;  Surgeon: Netta Cedars, MD;  Location: [place];  Service: Orthopedics;  Laterality: Left;   TONSILLECTOMY     WRIST SURGERY     LEFT    MEDICATIONS:  sodium chloride flush (NS) 0.9 % injection 3 mL    aspirin 81 MG EC tablet   atorvastatin (LIPITOR) 20 MG tablet   carvedilol (COREG) 3.125 MG tablet   clopidogrel (PLAVIX) 75 MG tablet    lisinopril (PRINIVIL,ZESTRIL) 10 MG tablet   nitroGLYCERIN (NITROSTAT) 0.4 MG SL tablet   pyridOXINE (VITAMIN B-6) 100 MG tablet   tamsulosin (FLOMAX) 0.4 MG CAPS capsule   ondansetron (ZOFRAN-ODT) 4 MG disintegrating tablet    Myra Gianotti, PA-C Surgical Short Stay/Anesthesiology [facility] Phone [phone] [facility] Phone [phone] 03/17/2022 1:24 PM

## 2022-03-27 ENCOUNTER — Telehealth: Payer: Self-pay | Admitting: Plastic Surgery

## 2022-03-31 ENCOUNTER — Encounter: Payer: No Typology Code available for payment source | Admitting: Plastic Surgery

## 2022-04-17 ENCOUNTER — Encounter: Payer: No Typology Code available for payment source | Admitting: Physician Assistant

## 2022-04-18 ENCOUNTER — Ambulatory Visit (INDEPENDENT_AMBULATORY_CARE_PROVIDER_SITE_OTHER): Payer: No Typology Code available for payment source | Admitting: Student

## 2022-04-18 ENCOUNTER — Encounter: Payer: Self-pay | Admitting: Student

## 2022-04-18 ENCOUNTER — Telehealth: Payer: Self-pay

## 2022-04-18 VITALS — BP 125/73 | HR 72 | Ht 71.0 in | Wt 160.0 lb

## 2022-04-18 DIAGNOSIS — H02403 Unspecified ptosis of bilateral eyelids: Secondary | ICD-10-CM

## 2022-04-18 NOTE — Telephone Encounter (Signed)
Called patient, LMVM inquiring for Dr. Marvel Plan' s [place] phone number to inquire for fax#

## 2022-04-18 NOTE — Progress Notes (Signed)
Patient ID: [name], male    DOB: [birth year], 76 y.o.   MRN: [id number]  Chief Complaint  Patient presents with   Pre-op Exam    No diagnosis found.   History of Present Illness: [name] is a 76 y.o.  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  He presents for preoperative evaluation for upcoming procedure, bilateral blepharoplasty with possible ptosis repair, scheduled for 05/03/2022 with Dr. Erin Hearing.  The patient has not had problems with anesthesia.  He reports he sometimes gets hiccups after anesthesia.  Patient reports he had a cardiac stent placed back in May.  He states he recently saw cardiology 3 weeks ago, and that everything looked okay.  Patient reports that cardiology was okay with him coming off of Plavix 1 week prior to surgery.  Patient denies any history or family history of blood clots or clotting diseases.  Patient denies any recent surgeries, traumas, infections, strokes or heart attacks.  Patient denies any history of inflammatory bowel disease or lung disease.  Patient does report a history of colon cancer back in 2010.  Patient denies any recent changes in his health.  Summary of Previous Visit: Patient was seen in the clinic on 03/06/2022 with history of eyelid ptosis left greater than right.  Job: Retired  [employer] Significant for: CAD with bypass graft, hypertension, angina pectoris   Past Medical History: Allergies: Allergies  Allergen Reactions   Hydrochlorothiazide Other (See Comments)    Other reaction(s): Gout    Current Medications:  Current Outpatient Medications:    aspirin 81 MG EC tablet, Take 81 mg by mouth daily., Disp: , Rfl:    atorvastatin (LIPITOR) 20 MG tablet, Take 20 mg by mouth daily., Disp: , Rfl:    carvedilol (COREG) 3.125 MG tablet, Take 1 tablet (3.125 mg total) by mouth 2 (two) times daily. (Patient taking differently: Take 6.25 mg by mouth 2 (two) times daily.), Disp: 180 tablet, Rfl: 3    clopidogrel (PLAVIX) 75 MG tablet, Take 1 tablet (75 mg total) by mouth daily., Disp: 90 tablet, Rfl: 3   lisinopril (PRINIVIL,ZESTRIL) 10 MG tablet, Take 10 mg by mouth daily., Disp: , Rfl:    ondansetron (ZOFRAN-ODT) 4 MG disintegrating tablet, Take 1 tablet (4 mg total) by mouth every 8 (eight) hours as needed for nausea or vomiting., Disp: 20 tablet, Rfl: 0   pyridOXINE (VITAMIN B-6) 100 MG tablet, Take 100 mg by mouth daily., Disp: , Rfl:    tamsulosin (FLOMAX) 0.4 MG CAPS capsule, Take 0.4 mg by mouth daily., Disp: , Rfl:    nitroGLYCERIN (NITROSTAT) 0.4 MG SL tablet, Place 1 tablet (0.4 mg total) under the tongue every 5 (five) minutes as needed for chest pain., Disp: 25 tablet, Rfl: 6  Current Facility-Administered Medications:    sodium chloride flush (NS) 0.9 % injection 3 mL, 3 mL, Intravenous, Q12H, Martinique, Peter M, MD  Past Medical Problems: Past Medical History:  Diagnosis Date   Cancer [facility])    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   Coronary artery disease    Hypertension     Past Surgical History: Past Surgical History:  Procedure Laterality Date   CORONARY ARTERY BYPASS GRAFT  01/18/2018   CABG 01/18/18: SVG-PDA, free RIMA-OM1-LAD with inflow from the LIMA as a Y graft, replacement of ascending aorta and hemi arch using 24 mm Gelweave vascular graft   CORONARY STENT INTERVENTION N/A 10/24/2021   Procedure: CORONARY STENT  INTERVENTION;  Surgeon: Martinique, Peter M, MD;  Location: [place] [facility];  Service: Cardiovascular;  Laterality: N/A;   HIP ARTHROPLASTY     BILAT   NECK MASS EXCISION     RT SIDE LYMPH NODE REMOVED   SHOULDER ARTHROSCOPY WITH SUBACROMIAL DECOMPRESSION AND OPEN ROTATOR C Left 08/13/2015   Procedure: LEFT SHOULDER ARTHROSCOPY WITH SUBACROMIAL DECOMPRESSION AND MINI OPEN ROTATOR CUFF REPAIR, BICEPS TENODESIS AND OPEN DISTAL CLAVICLE RESECTION;  Surgeon: Netta Cedars, MD;  Location: [place];  Service: Orthopedics;  Laterality: Left;   TONSILLECTOMY      WRIST SURGERY     LEFT    Social History: Social History   Socioeconomic History   Marital status: Single    Spouse name: Not on file   Number of children: Not on file   Years of education: Not on file   Highest education level: Not on file  Occupational History   Not on file  Tobacco Use   Smoking status: Never   Smokeless tobacco: Never  Substance and Sexual Activity   Alcohol use: No   Drug use: No   Sexual activity: Not on file  Other Topics Concern   Not on file  Social History Narrative   Left handed   One story home    Drinks no cafffeine   Social Determinants of Health   Financial Resource Strain: Not on file  Food Insecurity: Not on file  Transportation Needs: Not on file  Physical Activity: Not on file  Stress: Not on file  Social Connections: Not on file  Intimate Partner Violence: Not on file    Family History: No family history on file.  Review of Systems: No changes in interim health  Physical Exam: Vital Signs BP 125/73 (BP Location: Left Arm, Patient Position: Sitting, Cuff Size: Normal)   Pulse 72   Ht '5\' 11"'$  (1.803 m)   Wt 160 lb (72.6 kg)   SpO2 98%   BMI 22.32 kg/m   Physical Exam  Constitutional:      General: Not in acute distress.    Appearance: Normal appearance. Not ill-appearing.  HENT:     Head: Normocephalic and atraumatic.  Eyes:     Pupils: Pupils are equal, round Neck:     Musculoskeletal: Normal range of motion.  Cardiovascular:     Rate and Rhythm: Normal rate Pulmonary:     Effort: Pulmonary effort is normal. No respiratory distress.  Abdominal:     General: Abdomen is flat. There is no distension.  Musculoskeletal: Normal range of motion.  Lower extremities: There are a few scattered varicose veins.  Legs are nonswollen. Skin:    General: Skin is warm and dry.     Findings: No erythema or rash.  Neurological:     Mental Status: Alert and oriented to person, place, and time. Mental status is at baseline.   Psychiatric:        Mood and Affect: Mood normal.        Behavior: Behavior normal.    Assessment/Plan: The patient is scheduled for left lid blepharoplasty and ptosis repair and right lid blepharoplasty with possible ptosis repair with Dr. Erin Hearing.  Risks, benefits, and alternatives of procedure discussed, questions answered and consent obtained.    Smoking Status: Non-smoker; Counseling Given?  N/A   Caprini Score: 8; Risk Factors include: Age, history of malignancy, varicose veins and length of planned surgery. Recommendation for mechanical prophylaxis and possible chemoprophylaxis. Encourage early ambulation. Will discuss with Dr. Erin Hearing.  Pictures obtained: 01/07/2022  Post-op Rx sent to pharmacy:  Patient states he still has the medications from his last preoperative appointment with Dr. Erin Hearing. Instructed patient to hold on to these and to take them after surgery. Patient acknowledged.   Cardiac clearance to be sent to patient's cardiologist as well as clearance to hold plavix and aspirin for 7 days prior to surgery.   Patient was provided with the blepharoplasty and General Surgical Risk consent document and Pain Medication Agreement prior to their appointment.  They had adequate time to read through the risk consent documents and Pain Medication Agreement. We also discussed them in person together during this preop appointment. All of their questions were answered to their satisfaction.  Recommended calling if they have any further questions.  Risk consent form and Pain Medication Agreement to be scanned into patient's chart.  The risks that can be encountered with and after a blepharoplasty were discussed and include the following but no limited to these:  Asymmetry, dry eyes, lid lag, sensitivity to sun or bright light, difficulty closing your eyes, outward rolling of the eyelid, change in vision, fluid accumulation, firmness of the area, fat necrosis with death of fat tissue,  bleeding, infection, delayed healing, anesthesia risks, skin sensation changes, injury to structures including nerves, blood vessels, and muscles which may be temporary or permanent, hair loss, allergies to tape, suture materials and glues, blood products, topical preparations or injected agents, skin and contour irregularities, skin discoloration and swelling, deep vein thrombosis, cardiac and pulmonary complications, pain, which may persist, persistent pain, recurrence, poor healing of the incision, possible need for revisional surgery or staged procedures. Thiere can also be persistent swelling, poor wound healing, rippling or loose skin, swelling. Any change in weight fluctuations can alter the outcome.    Electronically signed by: Clance Boll, PA-C 04/18/2022 5:02 PM

## 2022-04-19 ENCOUNTER — Telehealth: Payer: Self-pay

## 2022-04-19 NOTE — Telephone Encounter (Signed)
Faxed Cardiac clearance and approval to come off plavix 1 week prior to surgery to Dr. Martinique

## 2022-04-21 ENCOUNTER — Other Ambulatory Visit: Payer: Self-pay

## 2022-04-21 ENCOUNTER — Telehealth: Payer: Self-pay

## 2022-04-21 DIAGNOSIS — I209 Angina pectoris, unspecified: Secondary | ICD-10-CM

## 2022-04-21 DIAGNOSIS — R0609 Other forms of dyspnea: Secondary | ICD-10-CM

## 2022-04-21 DIAGNOSIS — I25810 Atherosclerosis of coronary artery bypass graft(s) without angina pectoris: Secondary | ICD-10-CM

## 2022-04-21 NOTE — Telephone Encounter (Signed)
Patient called to follow up on authorization for surgery with Dr. Martinique. Requesting call back with status. Please advise at [phone]

## 2022-04-21 NOTE — Telephone Encounter (Signed)
Returned patients call. Advised Dr. Erin Hearing is ok to stay with Dr. Marvel Plan advise for surgery back in May. However, he does want patient to talk to anesthesiologist and have a clearance from them. Patient will wait for phone call.

## 2022-04-27 ENCOUNTER — Encounter: Payer: Self-pay | Admitting: Student

## 2022-04-27 NOTE — Progress Notes (Signed)
I reached out to patient's cardiologist on 04/18/22 regarding patient's medication management. Received message from patient's cardiologist Dr. Martinique on 04/18/22 that patient may hold plavix one week prior to surgery and can continue aspirin. I called patient at that time and updated him with the plan.   I called patient again today to confirm he knows to hold his plavix and continue his aspirin. Patient states he understands the plan and began holding his plavix yesterday and has been continuing his aspirin.   I also called the [place] preadmission center to have patient be seen by anesthesia prior to surgery. Preadmission is going to try and get an appointment with anesthesia for the patient.

## 2022-04-27 NOTE — H&P (View-Only) (Signed)
I reached out to patient's cardiologist on 04/18/22 regarding patient's medication management. Received message from patient's cardiologist Dr. Martinique on 04/18/22 that patient may hold plavix one week prior to surgery and can continue aspirin. I called patient at that time and updated him with the plan.   I called patient again today to confirm he knows to hold his plavix and continue his aspirin. Patient states he understands the plan and began holding his plavix yesterday and has been continuing his aspirin.   I also called the [place] preadmission center to have patient be seen by anesthesia prior to surgery. Preadmission is going to try and get an appointment with anesthesia for the patient.

## 2022-04-28 ENCOUNTER — Encounter (HOSPITAL_COMMUNITY)
Admission: RE | Admit: 2022-04-28 | Discharge: 2022-04-28 | Disposition: A | Discharge status: Home / Self Care | Payer: No Typology Code available for payment source | Source: Ambulatory Visit | Attending: Plastic Surgery | Admitting: Plastic Surgery

## 2022-04-28 DIAGNOSIS — I471 Supraventricular tachycardia: Secondary | ICD-10-CM | POA: Insufficient documentation

## 2022-04-28 DIAGNOSIS — H02129 Mechanical ectropion of unspecified eye, unspecified eyelid: Secondary | ICD-10-CM | POA: Diagnosis not present

## 2022-04-28 DIAGNOSIS — I10 Essential (primary) hypertension: Secondary | ICD-10-CM | POA: Insufficient documentation

## 2022-04-28 DIAGNOSIS — Z951 Presence of aortocoronary bypass graft: Secondary | ICD-10-CM | POA: Diagnosis not present

## 2022-04-28 DIAGNOSIS — Z01812 Encounter for preprocedural laboratory examination: Secondary | ICD-10-CM | POA: Insufficient documentation

## 2022-04-28 DIAGNOSIS — I25810 Atherosclerosis of coronary artery bypass graft(s) without angina pectoris: Secondary | ICD-10-CM

## 2022-04-28 DIAGNOSIS — Z01818 Encounter for other preprocedural examination: Secondary | ICD-10-CM

## 2022-04-28 LAB — BASIC METABOLIC PANEL
Anion gap: 7 (ref 5–15)
BUN: 26 mg/dL — ABNORMAL HIGH (ref 8–23)
CO2: 26 mmol/L (ref 22–32)
Calcium: 10.1 mg/dL (ref 8.9–10.3)
Chloride: 110 mmol/L (ref 98–111)
Creatinine, Ser: 1.38 mg/dL — ABNORMAL HIGH (ref 0.61–1.24)
GFR, Estimated: 53 mL/min — ABNORMAL LOW (ref >60)
Glucose, Bld: 105 mg/dL — ABNORMAL HIGH (ref 70–99)
Potassium: 5.1 mmol/L (ref 3.5–5.1)
Sodium: 143 mmol/L (ref 135–145)

## 2022-04-28 LAB — CBC
HCT: 39.5 % (ref 39.0–52.0)
Hemoglobin: 12.8 g/dL — ABNORMAL LOW (ref 13.0–17.0)
MCH: 31.1 pg (ref 26.0–34.0)
MCHC: 32.4 g/dL (ref 30.0–36.0)
MCV: 95.9 fL (ref 80.0–100.0)
Platelets: 139 10*3/uL — ABNORMAL LOW (ref 150–400)
RBC: 4.12 MIL/uL — ABNORMAL LOW (ref 4.22–5.81)
RDW: 13.5 % (ref 11.5–15.5)
WBC: 4.2 10*3/uL (ref 4.0–10.5)
nRBC: 0 % (ref 0.0–0.2)

## 2022-04-28 NOTE — Anesthesia Preprocedure Evaluation (Addendum)
Anesthesia Evaluation  Patient identified by MRN, date of birth, ID band Patient awake    Reviewed: Allergy & Precautions, NPO status , Patient's Chart, lab work & pertinent test results  Airway Mallampati: II  TM Distance: >3 FB Neck ROM: Full    Dental no notable dental hx. (+) Upper Dentures, Dental Advisory Given   Pulmonary neg pulmonary ROS,    Pulmonary exam normal breath sounds clear to auscultation       Cardiovascular hypertension, Pt. on medications + CAD and + Cardiac Stents  Normal cardiovascular exam Rhythm:Regular Rate:Normal     Neuro/Psych negative neurological ROS     GI/Hepatic negative GI ROS, Neg liver ROS,   Endo/Other    Renal/GU      Musculoskeletal  (+) Arthritis ,   Abdominal   Peds  Hematology Lab Results      Component                Value               Date                      WBC                      4.2                 04/28/2022                HGB                      12.8 (L)            04/28/2022                HCT                      39.5                04/28/2022                MCV                      95.9                04/28/2022                PLT                      139 (L)             04/28/2022              Anesthesia Other Findings All: HCTZ  Reproductive/Obstetrics                           Anesthesia Physical Anesthesia Plan  ASA: 3  Anesthesia Plan: General   Post-op Pain Management: Minimal or no pain anticipated   Induction: Intravenous  PONV Risk Score and Plan: 3 and Treatment may vary due to age or medical condition and Ondansetron  Airway Management Planned: LMA  Additional Equipment: None  Intra-op Plan:   Post-operative Plan: Extubation in OR  Informed Consent: I have reviewed the patients History and Physical, chart, labs and discussed the procedure including the risks, benefits and alternatives for the proposed  anesthesia with the patient or authorized representative who has indicated his/her  understanding and acceptance.     Dental advisory given  Plan Discussed with: Anesthesiologist and CRNA  Anesthesia Plan Comments: (See PAT note written 04/28/2022 by Myra Gianotti, PA-C. )     Anesthesia Quick Evaluation

## 2022-04-28 NOTE — Progress Notes (Signed)
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  He came in to PAT on 04/28/22 for a lab only visit and anesthesia APP evaluation per request of Dr. Erin Hearing. There were no RN PAT appointments available, so a RN will follow-up with a phone call prior to his surgery date.   His surgery was initially scheduled for 03/20/22, but he had a DES LCX on 10/24/21. His [place] cardiologist Dr. Marvel Plan had given permission to hold Plavix temporarily after five months of DAPT, but the 03/20/22 date was still < 5 months out from DES. In addition, the [facility] interventional cardiologist Dr. Martinique had advised to wait 6 months which our anesthesiologists preferred given current ACC/AHA Guidelines and elective surgery. Recommendation was for surgery to be postponed until after 04/30/22 so he could complete a full six months of DAPT (DES placed 04/23/22) before temporarily holding Plavix for surgery. This is all outlined in my previous anesthesia APP note from Date of Service 03/17/22. Since then, Donnamarie Rossetti, PA-C with plastic surgery reached out to Dr. Martinique again on 04/18/22 and confirmed that patient may hold Plavix for one week prior to surgery and continue ASA perioperatively. She reviewed this with Mr. Rafanan, and he reported that he held Plavix starting on 04/25/22 and is remaining on ASA.  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  Mr. Kneale denied any new changes since his last cardiology evaluation. He is ready to have his surgery. Exam as below.   04/28/22 labs acceptable for OR. Anesthesia team to evaluate on the day of surgery.    VS: BP 125/62   Temp 36.5 C (Oral)   Resp 17   Ht 5' 11"  (1.803 m)   Wt 71.8 kg   SpO2 100%   BMI 22.06 kg/m  HR 43 with initial vitals, 53-54 on recheck with pulse oximetry. He reported known slow HR with rates occasionally as low as 42-43 at home. Last EKG with HR 51 bpm.  Patient is a pleasant white male in NAD. He denied chest pain, SOB, dizziness, syncope. Heart overall RRR, one ectopic beat heard, no murmur. No pretibial edema. Good mouth opening. Denied loose teeth or issues with neck mobility or prior anesthesia reaction.    PROVIDERS: [facility] is where he received primary care. Last evaluated by Windy Fast, MD on 04/21/22.   Conrad Brooker, MD is cardiologist at [facility]. He was seen at [facility] by Oswaldo Milian, MD and Martinique, Peter, MD in 10/2021 when considering PCI which was done on 10/24/21. Last visit with Dr. Marvel Plan was on 02/15/22 with one year follow-up recommended.    Raynelle Jan, MD is vascular surgeon [facility]. Last visit 12/15/21 for mild, asymptomatic carotid stenosis. Anticipate one year follow-up.   Silvestre Moment, MD is ENT.  Last visit 04/14/22.     LABS: Labs reviewed: Acceptable for surgery. Cr 1.38. Previous labs done at the [facility] on 01/30/22 include: Creatinine 1.28, BUN 29, eGFR 58, glucose 90, HGB 13.7, HCT 40.8, PLT 147, AST 21, ALT 29, A1c 5.9%, TSH 2.79. (Cr 1.22-1.50 and PLT count 118-147 since 10/19/21.) (all labs ordered are listed, but only abnormal results are displayed)  Labs Reviewed  CBC - Abnormal; Notable for the following components:      Result  Value   RBC 4.12 (*)    Hemoglobin 12.8 (*)    Platelets 139 (*)    All other components within normal limits  BASIC METABOLIC PANEL - Abnormal; Notable for the following components:   Glucose, Bld 105 (*)    BUN 26 (*)    Creatinine, Ser 1.38 (*)    GFR, Estimated 53 (*)    All other components within normal limits     IMAGES: CT Soft Tissue Neck 01/09/22: IMPRESSION: - No pathologically enlarged lymph node is identified within the neck. - Nonspecific 2.9 cm focus of skin thickening and subcutaneous induration/stranding within the right perimandibular soft tissues. Direct visualization is recommended in this patient with a reported history of skin cancer. - Multiple surgical clips within the right neck. Correlate with the operative history. - Atherosclerotic plaque within the carotid bifurcations and proximal internal carotid arteries, which may result in hemodynamically significant stenoses, bilaterally. A carotid artery duplex is recommended for further evaluation. - Aortic Atherosclerosis (ICD10-I70.0). - Cervical spondylosis.     EKG: 10/24/21: SB at 51 bpm     CV: PCI 10/24/21:   Ost Cx lesion is 40% stenosed.   Prox Cx lesion is 80% stenosed.   Mid Cx lesion is 40% stenosed.   A drug-eluting stent was successfully placed using a SYNERGY XD 3.0X12.   Post intervention, there is a 0% residual stenosis.   Successful PCI of the proximal LCx with DES Plan: DAPT for 6 months. Anticipate same day DC.      US Carotid 10/04/21 ([facility]): IMPRESSION:  1.  No hemodynamically significant stenosis on either side.    2.  Both vertebral arteries are patent with antegrade flow.       Zio cardia monitor 11/2020 (as outlined in 12/09/21 [facility] cardiology note): Impression: Underlying rhythm is sinus with normal heart rate variability No atrial fibrillation or flutter No high-grade AV block or prolonged Brief episodes of nonsustained VT and SVT as detailed above,  asymptomatic Triggered events correspond to sinus rhythm   ECHO 10/31/20 (as outlined in 12/09/21 [facility] cardiology note): Interpretation Summary Left ventricular systolic function is normal. EF= 70% by 2D biplane method. No regional wall motion abnormalities noted. Grade I diastolic dysfunction, (abnormal relaxation pattern). There is borderline concentric left ventricular hypertrophy. No hemodynamically significant valvular aortic stenosis. There is trace mitral regurgitation. There is mild tricuspid regurgitation. Right ventricular systolic pressure is estimated at 25 mmHg. The proximal ascending aorta is not well visualized. There is no comparison study available.     LEFT HEART CATHETERIZATION 08/31/21 ([place]): Heart Rate: 51 Pressures (mmHg) Aorta: 258/ 60 LV Systolic: 527, EDP: 21  CORONARY ANGIOGRAPHY Native Vessels Summary: 3 vessel CAD Dominance: Right dominant  Stenoses Details Left Main 40%  distal Proximal LAD 50%  Mid LAD 75%  Proximal Circumflex 80%  Mid Circumflex 50%  Proximal RCA 70%  Mid RCA 80%  Distal RCA 100%  Right PDA 80%  ostial   BYPASS GRAFT ANGIOGRAPHY 1. SVG 40%  Distal RCA  proximal 2. Pedicle LIMA Mid LAD  Distal LIMA inserts into the free RIMA; one end of the [place] goes to OM1 and the other end to  the LAD. The segment to OM1 is occluded with some retrograde flow; the segment to LAD is patent.    Past Medical History:  Diagnosis Date   Cancer [facility])    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   Coronary artery disease    Hypertension     Past Surgical History:  Procedure Laterality Date   CORONARY ARTERY BYPASS GRAFT  01/18/2018   CABG 01/18/18: SVG-PDA, free RIMA-OM1-LAD with inflow from the LIMA as a Y graft, replacement of ascending aorta and hemi arch using 24 mm Gelweave vascular graft   CORONARY STENT INTERVENTION N/A 10/24/2021   Procedure: CORONARY STENT INTERVENTION;  Surgeon: Martinique, Peter M, MD;  Location: [place] CV  LAB;  Service: Cardiovascular;  Laterality: N/A;   HIP ARTHROPLASTY     BILAT   NECK MASS EXCISION     RT SIDE LYMPH NODE REMOVED   SHOULDER ARTHROSCOPY WITH SUBACROMIAL DECOMPRESSION AND OPEN ROTATOR C Left 08/13/2015   Procedure: LEFT SHOULDER ARTHROSCOPY WITH SUBACROMIAL DECOMPRESSION AND MINI OPEN ROTATOR CUFF REPAIR, BICEPS TENODESIS AND OPEN DISTAL CLAVICLE RESECTION;  Surgeon: Netta Cedars, MD;  Location: [facility];  Service: Orthopedics;  Laterality: Left;   TONSILLECTOMY     WRIST SURGERY     LEFT    MEDICATIONS:  aspirin 81 MG EC tablet   atorvastatin (LIPITOR) 20 MG tablet   carvedilol (COREG) 3.125 MG tablet   clopidogrel (PLAVIX) 75 MG tablet   lisinopril (PRINIVIL,ZESTRIL) 10 MG tablet   nitroGLYCERIN (NITROSTAT) 0.4 MG SL tablet   ondansetron (ZOFRAN-ODT) 4 MG disintegrating tablet   pyridOXINE (VITAMIN B-6) 100 MG tablet   tamsulosin (FLOMAX) 0.4 MG CAPS capsule    sodium chloride flush (NS) 0.9 % injection 3 mL    Myra Gianotti, PA-C Surgical Short Stay/Anesthesiology [facility] Phone [phone] [facility] Phone [phone] 04/28/2022 9:34 AM

## 2022-05-01 ENCOUNTER — Other Ambulatory Visit: Payer: Self-pay

## 2022-05-01 ENCOUNTER — Encounter (HOSPITAL_COMMUNITY): Payer: Self-pay | Admitting: Plastic Surgery

## 2022-05-03 ENCOUNTER — Ambulatory Visit (HOSPITAL_BASED_OUTPATIENT_CLINIC_OR_DEPARTMENT_OTHER): Payer: No Typology Code available for payment source | Admitting: Vascular Surgery

## 2022-05-03 ENCOUNTER — Encounter (HOSPITAL_COMMUNITY): Payer: Self-pay | Admitting: Plastic Surgery

## 2022-05-03 ENCOUNTER — Other Ambulatory Visit: Payer: Self-pay

## 2022-05-03 ENCOUNTER — Ambulatory Visit (HOSPITAL_COMMUNITY): Payer: No Typology Code available for payment source | Admitting: Vascular Surgery

## 2022-05-03 ENCOUNTER — Ambulatory Visit (HOSPITAL_COMMUNITY)
Admission: RE | Admit: 2022-05-03 | Discharge: 2022-05-03 | Disposition: A | Discharge status: Home / Self Care | Payer: No Typology Code available for payment source | Source: Ambulatory Visit | Attending: Plastic Surgery | Admitting: Plastic Surgery

## 2022-05-03 ENCOUNTER — Encounter (HOSPITAL_COMMUNITY)
Admission: RE | Disposition: A | Discharge status: Home / Self Care | Payer: Self-pay | Source: Ambulatory Visit | Attending: Plastic Surgery

## 2022-05-03 DIAGNOSIS — I2510 Atherosclerotic heart disease of native coronary artery without angina pectoris: Secondary | ICD-10-CM | POA: Diagnosis not present

## 2022-05-03 DIAGNOSIS — I10 Essential (primary) hypertension: Secondary | ICD-10-CM | POA: Diagnosis not present

## 2022-05-03 DIAGNOSIS — Z955 Presence of coronary angioplasty implant and graft: Secondary | ICD-10-CM | POA: Insufficient documentation

## 2022-05-03 DIAGNOSIS — Z79899 Other long term (current) drug therapy: Secondary | ICD-10-CM | POA: Diagnosis not present

## 2022-05-03 DIAGNOSIS — I25810 Atherosclerosis of coronary artery bypass graft(s) without angina pectoris: Secondary | ICD-10-CM

## 2022-05-03 DIAGNOSIS — H02402 Unspecified ptosis of left eyelid: Secondary | ICD-10-CM | POA: Diagnosis not present

## 2022-05-03 DIAGNOSIS — H02125 Mechanical ectropion of left lower eyelid: Secondary | ICD-10-CM | POA: Insufficient documentation

## 2022-05-03 DIAGNOSIS — H02403 Unspecified ptosis of bilateral eyelids: Secondary | ICD-10-CM | POA: Diagnosis not present

## 2022-05-03 DIAGNOSIS — H02836 Dermatochalasis of left eye, unspecified eyelid: Secondary | ICD-10-CM | POA: Diagnosis not present

## 2022-05-03 DIAGNOSIS — M1990 Unspecified osteoarthritis, unspecified site: Secondary | ICD-10-CM | POA: Insufficient documentation

## 2022-05-03 DIAGNOSIS — H02833 Dermatochalasis of right eye, unspecified eyelid: Secondary | ICD-10-CM | POA: Diagnosis not present

## 2022-05-03 DIAGNOSIS — Z01818 Encounter for other preprocedural examination: Secondary | ICD-10-CM

## 2022-05-03 DIAGNOSIS — H02831 Dermatochalasis of right upper eyelid: Secondary | ICD-10-CM | POA: Diagnosis not present

## 2022-05-03 DIAGNOSIS — H02834 Dermatochalasis of left upper eyelid: Secondary | ICD-10-CM | POA: Insufficient documentation

## 2022-05-03 DIAGNOSIS — H02122 Mechanical ectropion of right lower eyelid: Secondary | ICD-10-CM | POA: Insufficient documentation

## 2022-05-03 HISTORY — DX: Personal history of urinary calculi: Z87.442

## 2022-05-03 HISTORY — DX: Unspecified osteoarthritis, unspecified site: M19.90

## 2022-05-03 HISTORY — DX: Atherosclerotic heart disease of native coronary artery without angina pectoris: I25.10

## 2022-05-03 HISTORY — PX: BROW LIFT: SHX178

## 2022-05-03 SURGERY — BLEPHAROPLASTY
Anesthesia: General | Site: Eye | Laterality: Bilateral

## 2022-05-03 MED ORDER — LIDOCAINE-EPINEPHRINE 1 %-1:100000 IJ SOLN
INTRAMUSCULAR | Status: DC | PRN
  Administered 2022-05-03: 15 mL

## 2022-05-03 MED ORDER — PHENYLEPHRINE 80 MCG/ML (10ML) SYRINGE FOR IV PUSH (FOR BLOOD PRESSURE SUPPORT)
PREFILLED_SYRINGE | INTRAVENOUS | Status: DC | PRN
  Administered 2022-05-03: 100 ug via INTRAVENOUS

## 2022-05-03 MED ORDER — DEXAMETHASONE SODIUM PHOSPHATE 10 MG/ML IJ SOLN
INTRAMUSCULAR | Status: AC
  Filled 2022-05-03: qty 2

## 2022-05-03 MED ORDER — EPHEDRINE SULFATE-NACL 50-0.9 MG/10ML-% IV SOSY
PREFILLED_SYRINGE | INTRAVENOUS | Status: DC | PRN
  Administered 2022-05-03: 10 mg via INTRAVENOUS

## 2022-05-03 MED ORDER — CEFAZOLIN SODIUM-DEXTROSE 2-4 GM/100ML-% IV SOLN
2.0000 g | INTRAVENOUS | Status: AC
  Administered 2022-05-03: 2 g via INTRAVENOUS
  Filled 2022-05-03: qty 100

## 2022-05-03 MED ORDER — SODIUM CHLORIDE 0.9 % IV SOLN: INTRAVENOUS | Status: DC | PRN

## 2022-05-03 MED ORDER — ROCURONIUM BROMIDE 10 MG/ML (PF) SYRINGE
PREFILLED_SYRINGE | INTRAVENOUS | Status: AC
  Filled 2022-05-03: qty 10

## 2022-05-03 MED ORDER — PROPOFOL 10 MG/ML IV BOLUS
INTRAVENOUS | Status: AC
  Filled 2022-05-03: qty 20

## 2022-05-03 MED ORDER — PHENYLEPHRINE HCL-NACL 20-0.9 MG/250ML-% IV SOLN
INTRAVENOUS | Status: DC | PRN
  Administered 2022-05-03: 50 ug/min via INTRAVENOUS

## 2022-05-03 MED ORDER — CHLORHEXIDINE GLUCONATE 0.12 % MT SOLN
OROMUCOSAL | Status: AC
  Filled 2022-05-03: qty 15

## 2022-05-03 MED ORDER — CHLORHEXIDINE GLUCONATE CLOTH 2 % EX PADS: 6.0000 | MEDICATED_PAD | Freq: Once | CUTANEOUS | Status: DC

## 2022-05-03 MED ORDER — ONDANSETRON HCL 4 MG/2ML IJ SOLN
INTRAMUSCULAR | Status: AC
  Filled 2022-05-03: qty 4

## 2022-05-03 MED ORDER — BSS IO SOLN
INTRAOCULAR | Status: AC
  Filled 2022-05-03: qty 15

## 2022-05-03 MED ORDER — 0.9 % SODIUM CHLORIDE (POUR BTL) OPTIME
TOPICAL | Status: DC | PRN
  Administered 2022-05-03: 1000 mL

## 2022-05-03 MED ORDER — GLYCOPYRROLATE PF 0.2 MG/ML IJ SOSY
PREFILLED_SYRINGE | INTRAMUSCULAR | Status: AC
  Filled 2022-05-03: qty 1

## 2022-05-03 MED ORDER — FENTANYL CITRATE (PF) 250 MCG/5ML IJ SOLN
INTRAMUSCULAR | Status: DC | PRN
Start: 2022-05-03 — End: 2022-05-03
  Administered 2022-05-03 (×2): 25 ug via INTRAVENOUS

## 2022-05-03 MED ORDER — FENTANYL CITRATE (PF) 100 MCG/2ML IJ SOLN: 25.0000 ug | INTRAMUSCULAR | Status: DC | PRN

## 2022-05-03 MED ORDER — BSS IO SOLN
INTRAOCULAR | Status: DC | PRN
  Administered 2022-05-03: 30 mL via INTRAOCULAR

## 2022-05-03 MED ORDER — ACETAMINOPHEN 10 MG/ML IV SOLN: 1000.0000 mg | Freq: Once | INTRAVENOUS | Status: DC | PRN

## 2022-05-03 MED ORDER — TOBRAMYCIN-DEXAMETHASONE 0.3-0.1 % OP OINT
TOPICAL_OINTMENT | OPHTHALMIC | Status: AC
  Filled 2022-05-03: qty 3.5

## 2022-05-03 MED ORDER — DEXAMETHASONE SODIUM PHOSPHATE 10 MG/ML IJ SOLN
INTRAMUSCULAR | Status: DC | PRN
  Administered 2022-05-03: 5 mg via INTRAVENOUS

## 2022-05-03 MED ORDER — LIDOCAINE 2% (20 MG/ML) 5 ML SYRINGE
INTRAMUSCULAR | Status: DC | PRN
  Administered 2022-05-03: 30 mg via INTRAVENOUS

## 2022-05-03 MED ORDER — LIDOCAINE 2% (20 MG/ML) 5 ML SYRINGE
INTRAMUSCULAR | Status: AC
  Filled 2022-05-03: qty 10

## 2022-05-03 MED ORDER — EPHEDRINE 5 MG/ML INJ
INTRAVENOUS | Status: AC
  Filled 2022-05-03: qty 5

## 2022-05-03 MED ORDER — ONDANSETRON HCL 4 MG/2ML IJ SOLN: 4.0000 mg | Freq: Once | INTRAMUSCULAR | Status: DC | PRN

## 2022-05-03 MED ORDER — PROPOFOL 10 MG/ML IV BOLUS
INTRAVENOUS | Status: DC | PRN
  Administered 2022-05-03: 140 mg via INTRAVENOUS

## 2022-05-03 MED ORDER — LIDOCAINE-EPINEPHRINE 1 %-1:100000 IJ SOLN
INTRAMUSCULAR | Status: AC
  Filled 2022-05-03: qty 1

## 2022-05-03 MED ORDER — ONDANSETRON HCL 4 MG/2ML IJ SOLN
INTRAMUSCULAR | Status: DC | PRN
  Administered 2022-05-03: 4 mg via INTRAVENOUS

## 2022-05-03 MED ORDER — BACITRACIN-POLYMYXIN B 500-10000 UNIT/GM OP OINT
TOPICAL_OINTMENT | OPHTHALMIC | Status: AC
  Filled 2022-05-03: qty 3.5

## 2022-05-03 MED ORDER — FENTANYL CITRATE (PF) 250 MCG/5ML IJ SOLN
INTRAMUSCULAR | Status: AC
  Filled 2022-05-03: qty 5

## 2022-05-03 SURGICAL SUPPLY — 61 items
ADH SKN CLS APL DERMABOND .7 (GAUZE/BANDAGES/DRESSINGS)
APL SWBSTK 6 STRL LF DISP (MISCELLANEOUS) ×2
APPLICATOR COTTON TIP 6 STRL (MISCELLANEOUS) ×2 IMPLANT
APPLICATOR COTTON TIP 6IN STRL (MISCELLANEOUS) ×4
BLADE SURG 15 STRL LF DISP TIS (BLADE) ×1 IMPLANT
BLADE SURG 15 STRL SS (BLADE) ×2
BNDG ELASTIC 3X5.8 VLCR STR LF (GAUZE/BANDAGES/DRESSINGS) ×2 IMPLANT
BNDG EYE OVAL (GAUZE/BANDAGES/DRESSINGS) ×4 IMPLANT
BNDG GAUZE ELAST 4 BULKY (GAUZE/BANDAGES/DRESSINGS) ×2 IMPLANT
CANISTER SUCT 3000ML PPV (MISCELLANEOUS) ×2 IMPLANT
CLEANER TIP ELECTROSURG 2X2 (MISCELLANEOUS) IMPLANT
CORD BIPOLAR FORCEPS 12FT (ELECTRODE) ×1 IMPLANT
COVER SURGICAL LIGHT HANDLE (MISCELLANEOUS) ×2 IMPLANT
DERMABOND ADVANCED (GAUZE/BANDAGES/DRESSINGS)
DERMABOND ADVANCED .7 DNX12 (GAUZE/BANDAGES/DRESSINGS) IMPLANT
ELECT NDL BLADE 2-5/6 (NEEDLE) ×1 IMPLANT
ELECT NDL TIP 2.8 STRL (NEEDLE) ×1 IMPLANT
ELECT NEEDLE BLADE 2-5/6 (NEEDLE) ×2 IMPLANT
ELECT NEEDLE TIP 2.8 STRL (NEEDLE) ×2 IMPLANT
ELECT REM PT RETURN 9FT ADLT (ELECTROSURGICAL) ×2
ELECTRODE REM PT RTRN 9FT ADLT (ELECTROSURGICAL) ×1 IMPLANT
GAUZE SPONGE 4X4 12PLY STRL (GAUZE/BANDAGES/DRESSINGS) ×2 IMPLANT
GAUZE XEROFORM 1X8 LF (GAUZE/BANDAGES/DRESSINGS) ×2 IMPLANT
GLOVE BIO SURGEON STRL SZ7.5 (GLOVE) ×2 IMPLANT
GLOVE BIOGEL PI IND STRL 7.5 (GLOVE) ×1 IMPLANT
GLOVE BIOGEL PI INDICATOR 7.5 (GLOVE) ×1
GOWN STRL REUS W/ TWL LRG LVL3 (GOWN DISPOSABLE) ×2 IMPLANT
GOWN STRL REUS W/ TWL XL LVL3 (GOWN DISPOSABLE) ×1 IMPLANT
GOWN STRL REUS W/TWL LRG LVL3 (GOWN DISPOSABLE) ×4
GOWN STRL REUS W/TWL XL LVL3 (GOWN DISPOSABLE) ×2
KIT BASIN OR (CUSTOM PROCEDURE TRAY) ×2 IMPLANT
KIT TURNOVER KIT B (KITS) ×2 IMPLANT
MARKER SKIN DUAL TIP RULER LAB (MISCELLANEOUS) ×2 IMPLANT
NDL HYPO 30X.5 LL (NEEDLE) ×1 IMPLANT
NEEDLE HYPO 30X.5 LL (NEEDLE) ×2 IMPLANT
NS IRRIG 1000ML POUR BTL (IV SOLUTION) ×2 IMPLANT
PAD ARMBOARD 7.5X6 YLW CONV (MISCELLANEOUS) ×4 IMPLANT
PENCIL BUTTON HOLSTER BLD 10FT (ELECTRODE) ×2 IMPLANT
PROTECTOR CORNEAL (OPHTHALMIC RELATED) ×4 IMPLANT
STRIP CLOSURE SKIN 1/2X4 (GAUZE/BANDAGES/DRESSINGS) ×2 IMPLANT
SUT MERSILENE 4-0 S-2 (SUTURE) IMPLANT
SUT MNCRL 6-0 UNDY P1 1X18 (SUTURE) IMPLANT
SUT MONOCRYL 6-0 P1 1X18 (SUTURE)
SUT PDS AB 5-0 P3 18 (SUTURE) IMPLANT
SUT PLAIN 5 0 P 3 18 (SUTURE) IMPLANT
SUT PROLENE 4 0 P 3 18 (SUTURE) IMPLANT
SUT PROLENE 5 0 P 3 (SUTURE) IMPLANT
SUT PROLENE 6 0 P 1 18 (SUTURE) ×2 IMPLANT
SUT SILK 4 0 PS 2 (SUTURE) IMPLANT
SUT SILK 6 0 G 6 (SUTURE) ×4 IMPLANT
SUT SILK 6 0 P 1 (SUTURE) ×2 IMPLANT
SUT VIC AB 4-0 P-3 18X BRD (SUTURE) IMPLANT
SUT VIC AB 4-0 P3 18 (SUTURE)
SUT VICRYL 6 0 S 14 UNDY (SUTURE) ×1 IMPLANT
SYR CONTROL 10ML LL (SYRINGE) IMPLANT
TOWEL GREEN STERILE (TOWEL DISPOSABLE) ×2 IMPLANT
TOWEL GREEN STERILE FF (TOWEL DISPOSABLE) ×2 IMPLANT
TRAY ENT MC OR (CUSTOM PROCEDURE TRAY) ×2 IMPLANT
TUBE CONNECTING 12X1/4 (SUCTIONS) IMPLANT
WATER STERILE IRR 1000ML POUR (IV SOLUTION) ×2 IMPLANT
YANKAUER SUCT BULB TIP NO VENT (SUCTIONS) IMPLANT

## 2022-05-03 NOTE — Anesthesia Postprocedure Evaluation (Signed)
Anesthesia Post Note  Patient: [name]  Procedure(s) Performed: LEFT EYELID BLEPHAROPLASTY WITH PTOSIS REPAIR AND RIGHT EYELID BLEPHAROPLASTY WITH POSSIBLE PTOSIS REPAIR (Bilateral: Eye)     Patient location during evaluation: PACU Anesthesia Type: General Level of consciousness: awake and alert Pain management: pain level controlled Vital Signs Assessment: post-procedure vital signs reviewed and stable Respiratory status: spontaneous breathing, nonlabored ventilation, respiratory function stable and patient connected to nasal cannula oxygen Cardiovascular status: blood pressure returned to baseline and stable Postop Assessment: no apparent nausea or vomiting Anesthetic complications: no   No notable events documented.  Last Vitals:  Vitals:   05/03/22 1415 05/03/22 1430  BP: (!) 146/59 (!) 151/64  Pulse: (!) 47 (!) 45  Resp: 17 14  Temp:    SpO2: 96% 96%    Last Pain:  Vitals:   05/03/22 1003  TempSrc: Oral                 Barnet Glasgow

## 2022-05-03 NOTE — Transfer of Care (Signed)
Immediate Anesthesia Transfer of Care Note  Patient: [name]  Procedure(s) Performed: LEFT EYELID BLEPHAROPLASTY WITH PTOSIS REPAIR AND RIGHT EYELID BLEPHAROPLASTY WITH POSSIBLE PTOSIS REPAIR (Bilateral: Eye)  Patient Location: PACU  Anesthesia Type:General  Level of Consciousness: awake and alert   Airway & Oxygen Therapy: Patient Spontanous Breathing  Post-op Assessment: Report given to RN and Post -op Vital signs reviewed and stable  Post vital signs: Reviewed and stable  Last Vitals:  Vitals Value Taken Time  BP 155/64 05/03/22 1400  Temp 36.7 C 05/03/22 1357  Pulse 49 05/03/22 1404  Resp 13 05/03/22 1404  SpO2 97 % 05/03/22 1404  Vitals shown include unvalidated device data.  Last Pain:  Vitals:   05/03/22 1003  TempSrc: Oral         Complications: No notable events documented.

## 2022-05-03 NOTE — Interval H&P Note (Signed)
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  The various methods of treatment have been discussed with the patient and family. After consideration of risks, benefits and other options for treatment, the patient has consented to  Procedure(s) with comments: LEFT EYELID BLEPHAROPLASTY WITH PTOSIS REPAIR AND RIGHT EYELID BLEPHAROPLASTY WITH POSSIBLE PTOSIS REPAIR (Bilateral) - 1.5 hours as a surgical intervention.  The patient's history has been reviewed, patient examined, no change in status, stable for surgery.  I have reviewed the patient's chart and labs.  Questions were answered to the patient's satisfaction.     Lennice Sites

## 2022-05-03 NOTE — Op Note (Signed)
Operative Note   DATE OF OPERATION: 05/03/2022  SURGICAL DEPARTMENT: Plastic Surgery  PREOPERATIVE DIAGNOSES:    POSTOPERATIVE DIAGNOSES:  same  PROCEDURE:   1)  bilateral upper eyelid blepharoplasty 2) right eyelid ptosis repair with levator plication 3) left eyelid ptosis repair with levator advancement  SURGEON: Gennaro Lizotte P. Harriett Azar, MD  ASSISTANT: Krista Blue, [facility]  ANESTHESIA:  General.   COMPLICATIONS: None.   INDICATIONS FOR PROCEDURE:  The patient, [name] is a 76 y.o. 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 Risks, benefits and alternatives were fully discussed. Specific risks including but not limited to bleeding, infection, hematoma, seroma, scarring, pain, contracture, asymmetry, wound healing problems, and need for further surgery were all discussed. The patient did have an ample opportunity to have questions answered to satisfaction.   DESCRIPTION OF PROCEDURE:  The patient was taken to the operating room. SCDs were placed and preoperative antibiotics were given. General anesthesia was administered.  The patient's operative site was prepped and draped in a sterile fashion. A time out was performed and all information was confirmed to be correct.    1% lidocaine with epinephrine was injected into both upper eyelids after marking the eyelids with calipers.  After waiting 7 to 10 minutes we started the dissection on the right side using a 15 blade to make a skin incision.  Following this we placed a retractor on the right upper eyelid and dissected a skin muscle septal flap.  The open sky approach was used to view the levator aponeurosis.  We took a small amount of fat from the medial compartment on the upper eyelid.  He then plicated the levator aponeurosis placing a suture from the upper edge of the  tarsus to the levator aponeurosis midway between the muscular aponeurotic junction and the edge of the tarsus.  A 6-0 vicryl suture was sued to plicate the levator.  Attention was then turned toward the left eyelid.  Again a 15 blade was used to make a skin incision.  [place]ott scissors were used to dissect a skin muscle septal flap.  The levator aponeurosis and muscle of the musculoaponeurotic junction were visualized.  Dissection was then performed underneath the levator aponeurosis.  After confirming hemostasis a 6-0 silk suture was used to sew the upper edge of the tarsus to the muscular aponeurotic junction of the levator.  I confirmed adequate tension on the levator using a spring back test of [place].  After irrigating with BSS and confirming hemostasis both upper eyelids were closed with 6-0 prolene.  The advanced practice practitioner (APP) assisted throughout the case.  The APP was essential in retraction and counter traction when needed to make the case progress smoothly.  This retraction and assistance made it possible to see the tissue planes for the procedure.  The assistance was needed for hemostasis, tissue re-approximation and closure of the incision site.    The patient tolerated the procedure well.  There were no complications. The patient was allowed to wake from anesthesia, extubated and taken to the recovery room in satisfactory condition.

## 2022-05-03 NOTE — Anesthesia Procedure Notes (Signed)
Procedure Name: Intubation Date/Time: 05/03/2022 12:18 PM  Performed by: Eligha Bridegroom, CRNAPre-anesthesia Checklist: Patient identified, Emergency Drugs available, Suction available and Patient being monitored Patient Re-evaluated:Patient Re-evaluated prior to induction Oxygen Delivery Method: Circle System Utilized Preoxygenation: Pre-oxygenation with 100% oxygen Induction Type: IV induction Ventilation: Mask ventilation without difficulty Laryngoscope Size: Mac and 3 Grade View: Grade I Tube type: Oral Tube size: 7.5 mm Number of attempts: 1 Airway Equipment and Method: Stylet Placement Confirmation: ETT inserted through vocal cords under direct vision, positive ETCO2 and breath sounds checked- equal and bilateral Secured at: 21 cm Tube secured with: Tape Dental Injury: Teeth and Oropharynx as per pre-operative assessment

## 2022-05-03 NOTE — Discharge Instructions (Addendum)
1) Ice to eyelid(s) every 1 hour while awake for first 24 hours, may ice for additional days. 2) Elevate head of bed as much as possible for first 2-3 days to reduce swelling 3) Use Refresh PM ophthalmic ointment (or similar) in eye(s) before bed, may use gel or saline drops during the day  Apply light pressure and gauze as needed for bleeding.  Apply ice pack gently this evening to help with the swelling.  Mild bleeding as well as moderate amount of bruising around the eyes is expected.    Return to office for your post-op visit to have stitches removed next week.    Call the office if you experience any severe bleeding or visual changes.  Expect mild blurriness with the ophthalmic ointment.

## 2022-05-04 ENCOUNTER — Telehealth: Payer: Self-pay

## 2022-05-04 ENCOUNTER — Encounter (HOSPITAL_COMMUNITY): Payer: Self-pay | Admitting: Plastic Surgery

## 2022-05-04 NOTE — Telephone Encounter (Signed)
Patient called in stating he had surgery with Dr.Luppens yesterday, wanted to know when he can take a shower and or run water over his face.

## 2022-05-04 NOTE — Telephone Encounter (Signed)
He can shower normally, just advise him to avoid scrubbing his eyes.  Maybe avoid direct pressure from the shower and instead turn his face away from the shower head.  Thank you

## 2022-05-04 NOTE — Telephone Encounter (Signed)
Pt has been made aware of recommendations below and conveyed understanding.

## 2022-05-07 NOTE — Progress Notes (Signed)
Cardiology Office Note   Date:  05/09/2022   ID:  [name], DOB Mar 16, 1946, MRN [id number]  PCP:  Pcp, No  Cardiologist:   Juwon Scripter Martinique, MD   Chief Complaint  Patient presents with   Shortness of Breath      History of Present Illness: [name] is a 76 y.o. male who is seen for follow up CAD. He has a hx of CAD, hypertension, colon cancer.  He has a history of CABG in 2019 by Dr Clementeen Graham at [facility] with [place], LIMA to LIMA to the LAD and apparent free RIMA forming Y graft to the OM branch. 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  He presented with complaints of short of breath, particularly with walking up hills.  He typically golfs 3 days/week but has been limited by shortness of breath. He also reports having episodes of dizziness, especially when he walks.  Denies any syncopal episodes.  Reports has had intermittent chest pain as well.  Describes as left-sided chest pain.  [facility] [place] [facility] on 08/31/2021, reportedly showing severe native CAD (40% left main, 50% proximal LAD, 75% mid LAD, 80% proximal LCx, 50% mid LCx, 70% proximal RCA, 80% mid RCA, 100% distal RCA, 80% right PDA), patent SVG to RCA, LIMA to LAD patent but Y RIMA to the OM occluded.   He was referred for consideration of PCI to proximal LCx.   He did undergo successful PCI of the LCx on 10/24/21 with DES. Had a lot of bruising in his arm afterwards. He noted  a marked improvement in his breathing. Able to walk strenuously now and play golf without any limitations.   He did have blepharoplasty recently. Plavix was held for this procedure but he is back on it.  He reports that over the past 2 months he has recurrent DOE. States he can't walk 25 feet without getting SOB. Also just taking a shower. Has noted a little chest pain at night. Has not taken a Ntg. Symptoms resolve with rest. Very much like prior anginal  equivalent symptoms.     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   Coronary artery disease    History of kidney stones    one time   Hypertension     Past Surgical History:  Procedure Laterality Date   BROW LIFT Bilateral 05/03/2022   Procedure: LEFT EYELID BLEPHAROPLASTY WITH PTOSIS REPAIR AND RIGHT EYELID BLEPHAROPLASTY WITH POSSIBLE PTOSIS REPAIR;  Surgeon: Lennice Sites, MD;  Location: [place];  Service: Plastics;  Laterality: Bilateral;  1.5 hours   CORONARY ARTERY BYPASS GRAFT  01/18/2018   CABG 01/18/18: SVG-PDA, free RIMA-OM1-LAD with inflow from the LIMA as a Y graft, replacement of ascending aorta and hemi arch using 24 mm Gelweave vascular graft   CORONARY STENT INTERVENTION N/A 10/24/2021   Procedure: CORONARY STENT INTERVENTION;  Surgeon: Martinique, Mischelle Reeg M, MD;  Location: [facility];  Service: Cardiovascular;  Laterality: N/A;   HIP ARTHROPLASTY     BILAT   NECK MASS EXCISION     RT SIDE LYMPH NODE REMOVED   SHOULDER ARTHROSCOPY WITH SUBACROMIAL DECOMPRESSION AND OPEN ROTATOR C Left 08/13/2015   Procedure: LEFT SHOULDER ARTHROSCOPY WITH SUBACROMIAL DECOMPRESSION AND MINI OPEN ROTATOR CUFF REPAIR, BICEPS TENODESIS AND OPEN DISTAL CLAVICLE RESECTION;  Surgeon:  Netta Cedars, MD;  Location: [place];  Service: Orthopedics;  Laterality: Left;   TONSILLECTOMY     WRIST SURGERY     LEFT     Current Outpatient Medications  Medication Sig Dispense Refill   aspirin 81 MG EC tablet Take 81 mg by mouth daily.     atorvastatin (LIPITOR) 20 MG tablet Take 20 mg by mouth daily.     carvedilol (COREG) 3.125 MG tablet Take 1 tablet (3.125 mg total) by mouth 2 (two) times daily. (Patient taking differently: Take 6.25 mg by mouth 2 (two) times daily.) 180 tablet 3   clopidogrel (PLAVIX) 75 MG tablet Take 1 tablet (75 mg total) by mouth daily. 90 tablet 3   lisinopril (PRINIVIL,ZESTRIL) 10 MG tablet Take 10 mg by mouth  daily.     ondansetron (ZOFRAN-ODT) 4 MG disintegrating tablet Take 1 tablet (4 mg total) by mouth every 8 (eight) hours as needed for nausea or vomiting. 20 tablet 0   pyridOXINE (VITAMIN B-6) 100 MG tablet Take 100 mg by mouth daily.     tamsulosin (FLOMAX) 0.4 MG CAPS capsule Take 0.4 mg by mouth daily.     nitroGLYCERIN (NITROSTAT) 0.4 MG SL tablet Place 1 tablet (0.4 mg total) under the tongue every 5 (five) minutes as needed for chest pain. 25 tablet 6   Current Facility-Administered Medications  Medication Dose Route Frequency Provider Last Rate Last Admin   sodium chloride flush (NS) 0.9 % injection 3 mL  3 mL Intravenous Q12H Martinique, Yeraldi Fidler M, MD        Allergies:   Hydrochlorothiazide    Social History:  The patient  reports that he has never smoked. He has never used smokeless tobacco. He reports that he does not drink alcohol and does not use drugs.   Family History:  The patient's is negative for premature CAD.   ROS:  Please see the history of present illness.   Otherwise, review of systems are positive for none.   All other systems are reviewed and negative.    PHYSICAL EXAM: VS:  BP (!) 124/58   Pulse (!) 56   Ht '5\' 11"'$  (1.803 m)   Wt 158 lb (71.7 kg)   SpO2 100%   BMI 22.04 kg/m  , BMI Body mass index is 22.04 kg/m. GEN: Well nourished, well developed, in no acute distress HEENT: normal Neck: no JVD, carotid bruits, or masses Cardiac: RRR; no murmurs, rubs, or gallops,no edema  Respiratory:  clear to auscultation bilaterally, normal work of breathing GI: soft, nontender, nondistended, + BS MS: no deformity or atrophy. Minimal bruising at cath site. Skin: warm and dry, no rash Neuro:  Strength and sensation are intact Psych: euthymic mood, full affect   EKG:  EKG is ordered today. The ekg ordered today demonstrates NSR rate 65. Normal. I have personally reviewed and interpreted this study.    Recent Labs: 04/28/2022: BUN 26; Creatinine, Ser 1.38;  Hemoglobin 12.8; Platelets 139; Potassium 5.1; Sodium 143    Lipid Panel No results found for: "CHOL", "TRIG", "HDL", "CHOLHDL", "VLDL", "LDLCALC", "LDLDIRECT"    Wt Readings from Last 3 Encounters:  05/09/22 158 lb (71.7 kg)  05/03/22 160 lb (72.6 kg)  04/28/22 158 lb 3.2 oz (71.8 kg)      Other studies Reviewed: Additional studies/ records that were reviewed today include:  CORONARY STENT INTERVENTION   Conclusion      Ost Cx lesion is 40% stenosed.   Prox Cx lesion is 80% stenosed.  Mid Cx lesion is 40% stenosed.   A drug-eluting stent was successfully placed using a SYNERGY XD 3.0X12.   Post intervention, there is a 0% residual stenosis.   Successful PCI of the proximal LCx with DES   Plan: DAPT for 6 months. Anticipate same day DC.   Diagnostic Dominance: Right Intervention     ASSESSMENT AND PLAN:  1.  CAD: s/p CABG in 2019 with SVG-RCA, LIMA to  LAD and Free RIMA Y graft to OM branch. LIMA to the LAD is patent as is SVG to RCA. No visualization of Y graft to OM. The native LCx demonstrates a 80% stenosis proximally. There is modest calcification. Prior Echo showed normal LV function with EF 60%. There is mild TR and mild biatrial enlargement. No effusion.  Now s/p stenting of native LCx in January initially with marked improvement in symptoms. Now with recurrent DOE which is his anginal symptom -Continue aspirin, statin, Plavix -on low dose Coreg -As needed sublingual nitroglycerin -Concerned about restenosis. - recommend repeat cardiac cath with possible PCI. - The procedure and risks were reviewed including but not limited to death, myocardial infarction, stroke, arrythmias, bleeding, transfusion, emergency surgery, dye allergy, or renal dysfunction. The patient voices understanding and is agreeable to proceed.   2. Hypertension: On lisinopril 10 mg daily, carvedilol 3.125 mg twice daily.  Well controlled.   3. Hyperlipidemia: Continue atorvastatin 40 mg  daily. Will check labs today.      Disposition:   cardiac cath on Monday August 14   Signed, Donell Sliwinski [place]ique, MD  05/09/2022 7:55 AM    [facility] [address] [place], [place], [address] Phone [phone], Fax [phone]  d

## 2022-05-07 NOTE — H&P (View-Only) (Signed)
Cardiology Office Note   Date:  05/09/2022   ID:  [name], DOB 05-10-1946, MRN [id number]  PCP:  Pcp, No  Cardiologist:   Mava Suares [place], MD   Chief Complaint  Patient presents with   Shortness of Breath      History of Present Illness: [name] is a 76 y.o. male who is seen for follow up CAD. He has a hx of CAD, hypertension, colon cancer.  He has a history of CABG in 2019 by Dr Clementeen Graham at [facility] with [place], LIMA to LIMA to the LAD and apparent free RIMA forming Y graft to the OM branch. 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  He presented with complaints of short of breath, particularly with walking up hills.  He typically golfs 3 days/week but has been limited by shortness of breath. He also reports having episodes of dizziness, especially when he walks.  Denies any syncopal episodes.  Reports has had intermittent chest pain as well.  Describes as left-sided chest pain.  [facility] [place] [facility] on 08/31/2021, reportedly showing severe native CAD (40% left main, 50% proximal LAD, 75% mid LAD, 80% proximal LCx, 50% mid LCx, 70% proximal RCA, 80% mid RCA, 100% distal RCA, 80% right PDA), patent SVG to RCA, LIMA to LAD patent but Y RIMA to the OM occluded.   He was referred for consideration of PCI to proximal LCx.   He did undergo successful PCI of the LCx on 10/24/21 with DES. Had a lot of bruising in his arm afterwards. He noted  a marked improvement in his breathing. Able to walk strenuously now and play golf without any limitations.   He did have blepharoplasty recently. Plavix was held for this procedure but he is back on it.  He reports that over the past 2 months he has recurrent DOE. States he can't walk 25 feet without getting SOB. Also just taking a shower. Has noted a little chest pain at night. Has not taken a Ntg. Symptoms resolve with rest. Very much like prior anginal  equivalent symptoms.     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   Coronary artery disease    History of kidney stones    one time   Hypertension     Past Surgical History:  Procedure Laterality Date   BROW LIFT Bilateral 05/03/2022   Procedure: LEFT EYELID BLEPHAROPLASTY WITH PTOSIS REPAIR AND RIGHT EYELID BLEPHAROPLASTY WITH POSSIBLE PTOSIS REPAIR;  Surgeon: Lennice Sites, MD;  Location: [facility] [place];  Service: Plastics;  Laterality: Bilateral;  1.5 hours   CORONARY ARTERY BYPASS GRAFT  01/18/2018   CABG 01/18/18: SVG-PDA, free RIMA-OM1-LAD with inflow from the LIMA as a Y graft, replacement of ascending aorta and hemi arch using 24 mm Gelweave vascular graft   CORONARY STENT INTERVENTION N/A 10/24/2021   Procedure: CORONARY STENT INTERVENTION;  Surgeon: Martinique, Day Deery M, MD;  Location: [facility];  Service: Cardiovascular;  Laterality: N/A;   HIP ARTHROPLASTY     BILAT   NECK MASS EXCISION     RT SIDE LYMPH NODE REMOVED   SHOULDER ARTHROSCOPY WITH SUBACROMIAL DECOMPRESSION AND OPEN ROTATOR C Left 08/13/2015   Procedure: LEFT SHOULDER ARTHROSCOPY WITH SUBACROMIAL DECOMPRESSION AND MINI OPEN ROTATOR CUFF REPAIR, BICEPS TENODESIS AND OPEN DISTAL CLAVICLE RESECTION;  Surgeon:  Netta Cedars, MD;  Location: [facility];  Service: Orthopedics;  Laterality: Left;   TONSILLECTOMY     WRIST SURGERY     LEFT     Current Outpatient Medications  Medication Sig Dispense Refill   aspirin 81 MG EC tablet Take 81 mg by mouth daily.     atorvastatin (LIPITOR) 20 MG tablet Take 20 mg by mouth daily.     carvedilol (COREG) 3.125 MG tablet Take 1 tablet (3.125 mg total) by mouth 2 (two) times daily. (Patient taking differently: Take 6.25 mg by mouth 2 (two) times daily.) 180 tablet 3   clopidogrel (PLAVIX) 75 MG tablet Take 1 tablet (75 mg total) by mouth daily. 90 tablet 3   lisinopril (PRINIVIL,ZESTRIL) 10 MG tablet Take 10 mg by mouth  daily.     ondansetron (ZOFRAN-ODT) 4 MG disintegrating tablet Take 1 tablet (4 mg total) by mouth every 8 (eight) hours as needed for nausea or vomiting. 20 tablet 0   pyridOXINE (VITAMIN B-6) 100 MG tablet Take 100 mg by mouth daily.     tamsulosin (FLOMAX) 0.4 MG CAPS capsule Take 0.4 mg by mouth daily.     nitroGLYCERIN (NITROSTAT) 0.4 MG SL tablet Place 1 tablet (0.4 mg total) under the tongue every 5 (five) minutes as needed for chest pain. 25 tablet 6   Current Facility-Administered Medications  Medication Dose Route Frequency Provider Last Rate Last Admin   sodium chloride flush (NS) 0.9 % injection 3 mL  3 mL Intravenous Q12H Martinique, Rafaela Dinius M, MD        Allergies:   Hydrochlorothiazide    Social History:  The patient  reports that he has never smoked. He has never used smokeless tobacco. He reports that he does not drink alcohol and does not use drugs.   Family History:  The patient's is negative for premature CAD.   ROS:  Please see the history of present illness.   Otherwise, review of systems are positive for none.   All other systems are reviewed and negative.    PHYSICAL EXAM: VS:  BP (!) 124/58   Pulse (!) 56   Ht '5\' 11"'$  (1.803 m)   Wt 158 lb (71.7 kg)   SpO2 100%   BMI 22.04 kg/m  , BMI Body mass index is 22.04 kg/m. GEN: Well nourished, well developed, in no acute distress HEENT: normal Neck: no JVD, carotid bruits, or masses Cardiac: RRR; no murmurs, rubs, or gallops,no edema  Respiratory:  clear to auscultation bilaterally, normal work of breathing GI: soft, nontender, nondistended, + BS MS: no deformity or atrophy. Minimal bruising at cath site. Skin: warm and dry, no rash Neuro:  Strength and sensation are intact Psych: euthymic mood, full affect   EKG:  EKG is ordered today. The ekg ordered today demonstrates NSR rate 65. Normal. I have personally reviewed and interpreted this study.    Recent Labs: 04/28/2022: BUN 26; Creatinine, Ser 1.38;  Hemoglobin 12.8; Platelets 139; Potassium 5.1; Sodium 143    Lipid Panel No results found for: "CHOL", "TRIG", "HDL", "CHOLHDL", "VLDL", "LDLCALC", "LDLDIRECT"    Wt Readings from Last 3 Encounters:  05/09/22 158 lb (71.7 kg)  05/03/22 160 lb (72.6 kg)  04/28/22 158 lb 3.2 oz (71.8 kg)      Other studies Reviewed: Additional studies/ records that were reviewed today include:  CORONARY STENT INTERVENTION   Conclusion      Ost Cx lesion is 40% stenosed.   Prox Cx lesion is 80% stenosed.  Mid Cx lesion is 40% stenosed.   A drug-eluting stent was successfully placed using a SYNERGY XD 3.0X12.   Post intervention, there is a 0% residual stenosis.   Successful PCI of the proximal LCx with DES   Plan: DAPT for 6 months. Anticipate same day DC.   Diagnostic Dominance: Right Intervention     ASSESSMENT AND PLAN:  1.  CAD: s/p CABG in 2019 with SVG-RCA, LIMA to  LAD and Free RIMA Y graft to OM branch. LIMA to the LAD is patent as is SVG to RCA. No visualization of Y graft to OM. The native LCx demonstrates a 80% stenosis proximally. There is modest calcification. Prior Echo showed normal LV function with EF 60%. There is mild TR and mild biatrial enlargement. No effusion.  Now s/p stenting of native LCx in January initially with marked improvement in symptoms. Now with recurrent DOE which is his anginal symptom -Continue aspirin, statin, Plavix -on low dose Coreg -As needed sublingual nitroglycerin -Concerned about restenosis. - recommend repeat cardiac cath with possible PCI. - The procedure and risks were reviewed including but not limited to death, myocardial infarction, stroke, arrythmias, bleeding, transfusion, emergency surgery, dye allergy, or renal dysfunction. The patient voices understanding and is agreeable to proceed.   2. Hypertension: On lisinopril 10 mg daily, carvedilol 3.125 mg twice daily.  Well controlled.   3. Hyperlipidemia: Continue atorvastatin 40 mg  daily. Will check labs today.      Disposition:   cardiac cath on Monday August 14   Signed, Anginette Espejo [place]ique, MD  05/09/2022 7:55 AM    [facility] [address] [place], [place], [address] Phone [phone], Fax [phone]  d

## 2022-05-08 ENCOUNTER — Ambulatory Visit (INDEPENDENT_AMBULATORY_CARE_PROVIDER_SITE_OTHER): Payer: No Typology Code available for payment source | Admitting: Plastic Surgery

## 2022-05-08 DIAGNOSIS — H02403 Unspecified ptosis of bilateral eyelids: Secondary | ICD-10-CM

## 2022-05-08 NOTE — Progress Notes (Signed)
Status post bilateral upper blepharoplasty with ptosis repair.  Patient is very happy with his initial result.  Physical exam Excellent lid position 1 mm below upper limbus  Assessment and plan Patient is doing well and I am very happy with his initial lid position.  Sutures were removed and we will see him back in 2 to 3 weeks for recheck.

## 2022-05-09 ENCOUNTER — Ambulatory Visit (INDEPENDENT_AMBULATORY_CARE_PROVIDER_SITE_OTHER): Payer: No Typology Code available for payment source | Admitting: Cardiology

## 2022-05-09 ENCOUNTER — Other Ambulatory Visit: Payer: Self-pay | Admitting: Cardiology

## 2022-05-09 ENCOUNTER — Encounter: Payer: Self-pay | Admitting: Cardiology

## 2022-05-09 VITALS — BP 124/58 | HR 56 | Ht 71.0 in | Wt 158.0 lb

## 2022-05-09 DIAGNOSIS — I209 Angina pectoris, unspecified: Secondary | ICD-10-CM | POA: Diagnosis not present

## 2022-05-09 DIAGNOSIS — E785 Hyperlipidemia, unspecified: Secondary | ICD-10-CM

## 2022-05-09 DIAGNOSIS — I10 Essential (primary) hypertension: Secondary | ICD-10-CM

## 2022-05-09 DIAGNOSIS — I25118 Atherosclerotic heart disease of native coronary artery with other forms of angina pectoris: Secondary | ICD-10-CM | POA: Diagnosis not present

## 2022-05-09 MED ORDER — SODIUM CHLORIDE 0.9% FLUSH: 3.0000 mL | Freq: Two times a day (BID) | INTRAVENOUS | Status: DC

## 2022-05-09 NOTE — Addendum Note (Signed)
Addended by: Kathyrn Lass on: 05/09/2022 08:19 AM   Modules accepted: Orders

## 2022-05-09 NOTE — Addendum Note (Signed)
Addended by: Angelena Form on: 05/09/2022 08:24 AM   Modules accepted: Orders

## 2022-05-09 NOTE — Patient Instructions (Signed)
Medication Instructions:  Continue same medications *If you need a refill on your cardiac medications before your next appointment, please call your pharmacy*   Lab Work: Bmet,cbc,pt,lipid and hepatic panels   Testing/Procedures: Cardiac Cath scheduled Monday 8/14   Follow instructions below   Follow-Up: At [facility], you and your health needs are our priority.  As part of our continuing mission to provide you with exceptional heart care, we have created designated Provider Care Teams.  These Care Teams include your primary Cardiologist (physician) and Advanced Practice Providers (APPs -  Physician Assistants and Nurse Practitioners) who all work together to provide you with the care you need, when you need it.  We recommend signing up for the patient portal called "MyChart".  Sign up information is provided on this After Visit Summary.  MyChart is used to connect with patients for Virtual Visits (Telemedicine).  Patients are able to view lab/test results, encounter notes, upcoming appointments, etc.  Non-urgent messages can be sent to your provider as well.   To learn more about what you can do with MyChart, go to [email or web address].      Your next appointment:      The format for your next appointment: Office    Provider:  Merrill Vineland [place] [address] Dept: [phone] Loc: [place]  05/09/2022  You are scheduled for a Cardiac Catheterization on Monday, August 14 with Dr. Peter Martinique.  1. Please arrive at the Main Entrance A at [facility]: [place], [place] [address] at 5:30 AM (This time is two hours before your procedure to ensure your preparation). Free valet parking service is available.   Special note: Every effort is made to have your procedure done on time. Please understand that emergencies  sometimes delay scheduled procedures.  2. Diet: Do not eat solid foods after midnight.  You may have clear liquids until 5 AM upon the day of the procedure.  3. Labs: You will need to have blood drawn today bmet,cbc,pt,lipid and hepatic panels.  4. Medication instructions in preparation for your procedure:          On the morning of your procedure, take Aspirin and Plavix/Clopidogrel and any morning medicines NOT listed above.  You may use sips of water.  5. Plan to go home the same day, you will only stay overnight if medically necessary. 6. You MUST have a responsible adult to drive you home. 7. An adult MUST be with you the first 24 hours after you arrive home. 8. Bring a current list of your medications, and the last time and date medication taken. 9. Bring ID and current insurance cards. 10.Please wear clothes that are easy to get on and off and wear slip-on shoes.  Thank you for allowing [place] to care for you!   -- [facility]  Important Information About [place]

## 2022-05-10 ENCOUNTER — Telehealth: Payer: Self-pay | Admitting: Cardiology

## 2022-05-10 LAB — LIPID PANEL
Chol/HDL Ratio: 2.7 ratio (ref 0.0–5.0)
Cholesterol, Total: 138 mg/dL (ref 100–199)
HDL: 51 mg/dL (ref >39)
LDL Chol Calc (NIH): 73 mg/dL (ref 0–99)
Triglycerides: 71 mg/dL (ref 0–149)
VLDL Cholesterol Cal: 14 mg/dL (ref 5–40)

## 2022-05-10 LAB — CBC WITH DIFFERENTIAL/PLATELET
Basophils Absolute: 0 10*3/uL (ref 0.0–0.2)
Basos: 0 %
EOS (ABSOLUTE): 0.1 10*3/uL (ref 0.0–0.4)
Eos: 1 %
Hematocrit: 40.3 % (ref 37.5–51.0)
Hemoglobin: 13.5 g/dL (ref 13.0–17.7)
Immature Grans (Abs): 0.1 10*3/uL (ref 0.0–0.1)
Immature Granulocytes: 1 %
Lymphocytes Absolute: 1.1 10*3/uL (ref 0.7–3.1)
Lymphs: 21 %
MCH: 31.4 pg (ref 26.6–33.0)
MCHC: 33.5 g/dL (ref 31.5–35.7)
MCV: 94 fL (ref 79–97)
Monocytes Absolute: 0.4 10*3/uL (ref 0.1–0.9)
Monocytes: 8 %
Neutrophils Absolute: 3.3 10*3/uL (ref 1.4–7.0)
Neutrophils: 69 %
Platelets: 170 10*3/uL (ref 150–450)
RBC: 4.3 x10E6/uL (ref 4.14–5.80)
RDW: 13.8 % (ref 11.6–15.4)
WBC: 4.9 10*3/uL (ref 3.4–10.8)

## 2022-05-10 LAB — BASIC METABOLIC PANEL
BUN/Creatinine Ratio: 20 (ref 10–24)
BUN: 27 mg/dL (ref 8–27)
CO2: 22 mmol/L (ref 20–29)
Calcium: 10.2 mg/dL (ref 8.6–10.2)
Chloride: 108 mmol/L — ABNORMAL HIGH (ref 96–106)
Creatinine, Ser: 1.36 mg/dL — ABNORMAL HIGH (ref 0.76–1.27)
Glucose: 93 mg/dL (ref 70–99)
Potassium: 5 mmol/L (ref 3.5–5.2)
Sodium: 144 mmol/L (ref 134–144)
eGFR: 54 mL/min/{1.73_m2} — ABNORMAL LOW (ref >59)

## 2022-05-10 LAB — HEPATIC FUNCTION PANEL
ALT: 24 IU/L (ref 0–44)
AST: 26 IU/L (ref 0–40)
Albumin: 4.6 g/dL (ref 3.8–4.8)
Alkaline Phosphatase: 133 IU/L — ABNORMAL HIGH (ref 44–121)
Bilirubin Total: 0.4 mg/dL (ref 0.0–1.2)
Bilirubin, Direct: 0.12 mg/dL (ref 0.00–0.40)
Total Protein: 6.7 g/dL (ref 6.0–8.5)

## 2022-05-10 LAB — PT AND PTT
INR: 1 (ref 0.9–1.2)
Prothrombin Time: 10.9 s (ref 9.1–12.0)
aPTT: 26 s (ref 24–33)

## 2022-05-10 NOTE — Telephone Encounter (Signed)
Spoke with pt regarding consult needed. Pt wanted to clarify if there was another consult needed and review instructions for cath lab procedure to be done on 8/14. Pt also asks if when have been able to get a prior auth on his upcoming procedure. I explained that we send these to a cue and that are worked through. Explained that if there was any issue getting approval he would be contacted. Pt verbalizes understanding and will call back if he has additional questions.

## 2022-05-10 NOTE — Telephone Encounter (Signed)
Patient is requesting to speak with [name]. He states during 8/08 appointment he was advised he would need a consult prior to 8/14 procedure. Please advise.

## 2022-05-12 ENCOUNTER — Telehealth: Payer: Self-pay | Admitting: Cardiology

## 2022-05-12 NOTE — Telephone Encounter (Signed)
Spoke to patient advised [facility] last office visit and upcoming cardiac cath.

## 2022-05-12 NOTE — Telephone Encounter (Signed)
Patient would like for [name] to give him a call.

## 2022-05-15 ENCOUNTER — Encounter (HOSPITAL_COMMUNITY)
Admission: RE | Disposition: A | Discharge status: Home / Self Care | Payer: Self-pay | Source: Home / Self Care | Attending: Cardiology

## 2022-05-15 ENCOUNTER — Encounter (HOSPITAL_COMMUNITY): Payer: Self-pay | Admitting: Cardiology

## 2022-05-15 ENCOUNTER — Ambulatory Visit (HOSPITAL_COMMUNITY)
Admission: RE | Admit: 2022-05-15 | Discharge: 2022-05-15 | Disposition: A | Discharge status: Home / Self Care | Payer: No Typology Code available for payment source | Attending: Cardiology | Admitting: Cardiology

## 2022-05-15 DIAGNOSIS — I2582 Chronic total occlusion of coronary artery: Secondary | ICD-10-CM | POA: Insufficient documentation

## 2022-05-15 DIAGNOSIS — Z7982 Long term (current) use of aspirin: Secondary | ICD-10-CM | POA: Insufficient documentation

## 2022-05-15 DIAGNOSIS — I25810 Atherosclerosis of coronary artery bypass graft(s) without angina pectoris: Secondary | ICD-10-CM | POA: Diagnosis not present

## 2022-05-15 DIAGNOSIS — I209 Angina pectoris, unspecified: Secondary | ICD-10-CM

## 2022-05-15 DIAGNOSIS — I25119 Atherosclerotic heart disease of native coronary artery with unspecified angina pectoris: Secondary | ICD-10-CM

## 2022-05-15 DIAGNOSIS — I10 Essential (primary) hypertension: Secondary | ICD-10-CM | POA: Diagnosis not present

## 2022-05-15 DIAGNOSIS — Z79899 Other long term (current) drug therapy: Secondary | ICD-10-CM | POA: Diagnosis not present

## 2022-05-15 DIAGNOSIS — Z955 Presence of coronary angioplasty implant and graft: Secondary | ICD-10-CM | POA: Diagnosis not present

## 2022-05-15 DIAGNOSIS — R0609 Other forms of dyspnea: Secondary | ICD-10-CM | POA: Diagnosis present

## 2022-05-15 DIAGNOSIS — E785 Hyperlipidemia, unspecified: Secondary | ICD-10-CM | POA: Diagnosis not present

## 2022-05-15 DIAGNOSIS — Z7902 Long term (current) use of antithrombotics/antiplatelets: Secondary | ICD-10-CM | POA: Diagnosis not present

## 2022-05-15 DIAGNOSIS — I2584 Coronary atherosclerosis due to calcified coronary lesion: Secondary | ICD-10-CM | POA: Insufficient documentation

## 2022-05-15 HISTORY — PX: LEFT HEART CATH AND CORONARY ANGIOGRAPHY: CATH118249

## 2022-05-15 HISTORY — PX: CORONARY PRESSURE WIRE/FFR WITH 3D MAPPING: CATH118309

## 2022-05-15 LAB — POCT ACTIVATED CLOTTING TIME: Activated Clotting Time: 281 seconds

## 2022-05-15 SURGERY — LEFT HEART CATH AND CORONARY ANGIOGRAPHY
Anesthesia: LOCAL

## 2022-05-15 MED ORDER — HEPARIN (PORCINE) IN NACL 1000-0.9 UT/500ML-% IV SOLN
INTRAVENOUS | Status: DC | PRN
  Administered 2022-05-15 (×2): 500 mL

## 2022-05-15 MED ORDER — SODIUM CHLORIDE 0.9% FLUSH: 3.0000 mL | Freq: Two times a day (BID) | INTRAVENOUS | Status: DC

## 2022-05-15 MED ORDER — SODIUM CHLORIDE 0.9% FLUSH: 3.0000 mL | INTRAVENOUS | Status: DC | PRN

## 2022-05-15 MED ORDER — HEPARIN (PORCINE) IN NACL 1000-0.9 UT/500ML-% IV SOLN
INTRAVENOUS | Status: AC
  Filled 2022-05-15: qty 1000

## 2022-05-15 MED ORDER — HEPARIN SODIUM (PORCINE) 1000 UNIT/ML IJ SOLN
INTRAMUSCULAR | Status: DC | PRN
  Administered 2022-05-15 (×2): 3500 [IU] via INTRAVENOUS

## 2022-05-15 MED ORDER — SODIUM CHLORIDE 0.9 % IV SOLN: 250.0000 mL | INTRAVENOUS | Status: DC | PRN

## 2022-05-15 MED ORDER — NITROGLYCERIN 1 MG/10 ML FOR IR/CATH LAB
INTRA_ARTERIAL | Status: AC
  Filled 2022-05-15: qty 10

## 2022-05-15 MED ORDER — SODIUM CHLORIDE 0.9 % WEIGHT BASED INFUSION: 1.0000 mL/kg/h | INTRAVENOUS | Status: AC

## 2022-05-15 MED ORDER — HEPARIN SODIUM (PORCINE) 1000 UNIT/ML IJ SOLN
INTRAMUSCULAR | Status: AC
  Filled 2022-05-15: qty 10

## 2022-05-15 MED ORDER — MIDAZOLAM HCL 2 MG/2ML IJ SOLN
INTRAMUSCULAR | Status: DC | PRN
  Administered 2022-05-15: 2 mg via INTRAVENOUS

## 2022-05-15 MED ORDER — ACETAMINOPHEN 325 MG PO TABS: 650.0000 mg | ORAL_TABLET | ORAL | Status: DC | PRN

## 2022-05-15 MED ORDER — SODIUM CHLORIDE 0.9 % WEIGHT BASED INFUSION
3.0000 mL/kg/h | INTRAVENOUS | Status: AC
  Administered 2022-05-15: 3 mL/kg/h via INTRAVENOUS

## 2022-05-15 MED ORDER — ONDANSETRON HCL 4 MG/2ML IJ SOLN: 4.0000 mg | Freq: Four times a day (QID) | INTRAMUSCULAR | Status: DC | PRN

## 2022-05-15 MED ORDER — NITROGLYCERIN 1 MG/10 ML FOR IR/CATH LAB
INTRA_ARTERIAL | Status: DC | PRN
  Administered 2022-05-15: 200 ug via INTRACORONARY

## 2022-05-15 MED ORDER — SODIUM CHLORIDE 0.9 % WEIGHT BASED INFUSION: 1.0000 mL/kg/h | INTRAVENOUS | Status: DC

## 2022-05-15 MED ORDER — IOHEXOL 350 MG/ML SOLN
INTRAVENOUS | Status: DC | PRN
  Administered 2022-05-15: 115 mL

## 2022-05-15 MED ORDER — VERAPAMIL HCL 2.5 MG/ML IV SOLN
INTRAVENOUS | Status: AC
  Filled 2022-05-15: qty 2

## 2022-05-15 MED ORDER — FENTANYL CITRATE (PF) 100 MCG/2ML IJ SOLN
INTRAMUSCULAR | Status: AC
  Filled 2022-05-15: qty 2

## 2022-05-15 MED ORDER — LIDOCAINE HCL (PF) 1 % IJ SOLN
INTRAMUSCULAR | Status: DC | PRN
  Administered 2022-05-15: 2 mL

## 2022-05-15 MED ORDER — VERAPAMIL HCL 2.5 MG/ML IV SOLN
INTRAVENOUS | Status: DC | PRN
  Administered 2022-05-15: 10 mL via INTRA_ARTERIAL

## 2022-05-15 MED ORDER — LIDOCAINE HCL (PF) 1 % IJ SOLN
INTRAMUSCULAR | Status: AC
  Filled 2022-05-15: qty 30

## 2022-05-15 MED ORDER — MIDAZOLAM HCL 2 MG/2ML IJ SOLN
INTRAMUSCULAR | Status: AC
  Filled 2022-05-15: qty 2

## 2022-05-15 MED ORDER — ASPIRIN 81 MG PO CHEW: 81.0000 mg | CHEWABLE_TABLET | ORAL | Status: AC

## 2022-05-15 MED ORDER — FENTANYL CITRATE (PF) 100 MCG/2ML IJ SOLN
INTRAMUSCULAR | Status: DC | PRN
  Administered 2022-05-15: 25 ug via INTRAVENOUS

## 2022-05-15 SURGICAL SUPPLY — 14 items
CATH INFINITI 5 FR IM (CATHETERS) ×1 IMPLANT
CATH INFINITI 5FR MULTPACK ANG (CATHETERS) ×1 IMPLANT
CATH LAUNCHER 6FR EBU 4 (CATHETERS) ×1 IMPLANT
CATH LAUNCHER 6FR EBU3.5 (CATHETERS) ×1 IMPLANT
DEVICE RAD COMP TR BAND LRG (VASCULAR PRODUCTS) ×1 IMPLANT
GLIDESHEATH SLEND SS 6F .021 (SHEATH) ×1 IMPLANT
GUIDEWIRE INQWIRE 1.5J.035X260 (WIRE) IMPLANT
GUIDEWIRE PRESSURE X 175 (WIRE) ×1 IMPLANT
INQWIRE 1.5J .035X260CM (WIRE) ×2
KIT ESSENTIALS PG (KITS) ×1 IMPLANT
KIT HEART LEFT (KITS) ×2 IMPLANT
PACK CARDIAC CATHETERIZATION (CUSTOM PROCEDURE TRAY) ×2 IMPLANT
TRANSDUCER W/STOPCOCK (MISCELLANEOUS) ×2 IMPLANT
TUBING CIL FLEX 10 FLL-RA (TUBING) ×2 IMPLANT

## 2022-05-15 NOTE — Interval H&P Note (Signed)
History and Physical Interval Note:  05/15/2022 7:20 AM  Hughes Better  has presented today for surgery, with the diagnosis of SHORTNESS OF BREATH, angina.  The various methods of treatment have been discussed with the patient and family. After consideration of risks, benefits and other options for treatment, the patient has consented to  Procedure(s): LEFT HEART CATH AND CORONARY ANGIOGRAPHY (N/A) as a surgical intervention.  The patient's history has been reviewed, patient examined, no change in status, stable for surgery.  I have reviewed the patient's chart and labs.  Questions were answered to the patient's satisfaction.    Cath Lab Visit (complete for each Cath Lab visit)  Clinical Evaluation Leading to the Procedure:   ACS: No.  Non-ACS:    Anginal Classification: CCS III  Anti-ischemic medical therapy: Minimal Therapy (1 class of medications)  Non-Invasive Test Results: No non-invasive testing performed  Prior CABG: Previous CABG       Collier Salina [facility] 05/15/2022 7:20 AM

## 2022-05-15 NOTE — Progress Notes (Signed)
Noticed swelling at TR band site, held pressure for 2 additional minutes. Will continue to remove air at [place]

## 2022-05-15 NOTE — Progress Notes (Signed)
After removing 3cc of air from TR band, oozing and bruising/swelling noted at site. 2cc of air added back to TR band and pressure held below site for 2 min. Oozing resolved bruising noted.

## 2022-05-17 ENCOUNTER — Encounter (HOSPITAL_COMMUNITY): Payer: Self-pay | Admitting: Cardiology

## 2022-05-22 ENCOUNTER — Ambulatory Visit: Payer: No Typology Code available for payment source | Admitting: Physician Assistant

## 2022-05-23 ENCOUNTER — Encounter: Payer: Self-pay | Admitting: Student

## 2022-05-23 ENCOUNTER — Ambulatory Visit (INDEPENDENT_AMBULATORY_CARE_PROVIDER_SITE_OTHER): Payer: No Typology Code available for payment source | Admitting: Student

## 2022-05-23 DIAGNOSIS — H02403 Unspecified ptosis of bilateral eyelids: Secondary | ICD-10-CM

## 2022-05-23 NOTE — Progress Notes (Signed)
Patient is a 76 year old male who underwent bilateral upper blepharoplasty with ptosis repair on 05/03/2022 with Dr. Erin Hearing.  Patient presents to the clinic for postoperative follow-up.  Patient was last seen in the clinic on 05/08/2022.  At this visit, patient was happy with his result.  His sutures were removed and the plan was for the patient to follow-up in 2 to 3 weeks for a recheck.  Today, patient reports he is doing well.  He states that he is very pleased with his results.  Patient states that his eyes feel dry sometimes.  He denies any other issues or complaints at this time.  He states he does not have issues closing his eyes.  On exam, patient is sitting upright in no acute distress.  His incisions are intact and healing well.  There is no surrounding erythema or drainage from the incisions.  His eyes closed without difficulty.  There is no irritation or erythema noted to patient's eyes bilaterally.  I discussed with the patient to continue to use the refresh gel at night.    Pictures were obtained of the patient and placed in the chart with the patient's or guardian's permission.   Instructed patient to call if he has any questions or concerns.  Patient to follow-up next week with Dr. Erin Hearing

## 2022-05-25 NOTE — Progress Notes (Signed)
Cardiology Office Note   Date:  06/02/2022   ID:  [name], DOB 13-Jan-1946, MRN [id number]  PCP:  Pcp, No  Cardiologist:   Nylia Gavina Martinique, MD   Chief Complaint  Patient presents with   Follow-up   Shortness of Breath      History of Present Illness: [name] is a 76 y.o. male who is seen for follow up CAD. He has a hx of CAD, hypertension, colon cancer.  He has a history of CABG in 2019 by Dr Clementeen Graham at [facility] with [place], LIMA to LIMA to the LAD and apparent free RIMA forming Y graft to the OM branch. 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  He presented with complaints of short of breath, particularly with walking up hills.  He typically golfs 3 days/week but has been limited by shortness of breath. He also reports having episodes of dizziness, especially when he walks.  Denies any syncopal episodes.  Reports has had intermittent chest pain as well.  Describes as left-sided chest pain.  [facility] [place] [facility]/2021, reportedly showing severe native CAD (40% left main, 50% proximal LAD, 75% mid LAD, 80% proximal LCx, 50% mid LCx, 70% proximal RCA, 80% mid RCA, 100% distal RCA, 80% right PDA), patent SVG to RCA, LIMA to LAD patent but Y RIMA to the OM occluded.   He was referred for consideration of PCI to proximal LCx.   He did undergo successful PCI of the LCx on 10/24/21 with DES. Had a lot of bruising in his arm afterwards. He noted  a marked improvement in his breathing. Able to walk strenuously now and play golf without any limitations.   He reports that over the past 2 months he has recurrent DOE. States he can't walk 25 feet without getting SOB. Also just taking a shower. Has noted a little chest pain at night. Has not taken a Ntg. Symptoms resolve with rest. Worse with bending over.  For this reason repeat cardiac cath was done with patent LCx stent. Patent grafts to  LAD and RCA. Normal LV function. Normal LVEDP.  On follow up today he continues to experience his SOB. No edema. No cough. Had some skin cancer removed from his arm. Bruises easily.     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   Coronary artery disease    History of kidney stones    one time   Hypertension     Past Surgical History:  Procedure Laterality Date   BROW LIFT Bilateral 05/03/2022   Procedure: LEFT EYELID BLEPHAROPLASTY WITH PTOSIS REPAIR AND RIGHT EYELID BLEPHAROPLASTY WITH POSSIBLE PTOSIS REPAIR;  Surgeon: Lennice Sites, MD;  Location: [place];  Service: Plastics;  Laterality: Bilateral;  1.5 hours   CORONARY ARTERY BYPASS GRAFT  01/18/2018   CABG 01/18/18: SVG-PDA, free RIMA-OM1-LAD with inflow from the LIMA as a Y graft, replacement of ascending aorta and hemi arch using 24 mm Gelweave vascular graft   CORONARY PRESSURE WIRE/FFR WITH 3D MAPPING N/A 05/15/2022   Procedure: Coronary Pressure Wire/FFR w/3D Mapping;  Surgeon: Martinique, Tymel Conely M, MD;  Location: [place] [facility];  Service: Cardiovascular;  Laterality: N/A;   CORONARY STENT INTERVENTION N/A 10/24/2021   Procedure: CORONARY STENT INTERVENTION;  Surgeon: Martinique, Mohid Furuya M, MD;  Location: [facility] IN[facility]  [facility];  Service: Cardiovascular;  Laterality: N/A;   HIP ARTHROPLASTY     BILAT   LEFT HEART CATH AND CORONARY ANGIOGRAPHY N/A 05/15/2022   Procedure: LEFT HEART CATH AND CORONARY ANGIOGRAPHY;  Surgeon: Martinique, Shalunda Lindh M, MD;  Location: [place] [facility];  Service: Cardiovascular;  Laterality: N/A;   NECK MASS EXCISION     RT SIDE LYMPH NODE REMOVED   SHOULDER ARTHROSCOPY WITH SUBACROMIAL DECOMPRESSION AND OPEN ROTATOR C Left 08/13/2015   Procedure: LEFT SHOULDER ARTHROSCOPY WITH SUBACROMIAL DECOMPRESSION AND MINI OPEN ROTATOR CUFF REPAIR, BICEPS TENODESIS AND OPEN DISTAL CLAVICLE RESECTION;  Surgeon: Netta Cedars, MD;  Location: [place];  Service: Orthopedics;   Laterality: Left;   TONSILLECTOMY     WRIST SURGERY     LEFT     Current Outpatient Medications  Medication Sig Dispense Refill   albuterol (VENTOLIN HFA) 108 (90 Base) MCG/ACT inhaler Inhale 2 puffs into the lungs every 6 (six) hours as needed for wheezing or shortness of breath. 8 g 2   aspirin 81 MG EC tablet Take 81 mg by mouth daily.     atorvastatin (LIPITOR) 20 MG tablet Take 20 mg by mouth every evening.     Budeson-Glycopyrrol-Formoterol (BREZTRI AEROSPHERE) 160-9-4.8 MCG/ACT AERO Inhale 2 puffs into the lungs in the morning and at bedtime. 1 each 3   carvedilol (COREG) 3.125 MG tablet Take 1 tablet (3.125 mg total) by mouth 2 (two) times daily. 180 tablet 3   clopidogrel (PLAVIX) 75 MG tablet Take 1 tablet (75 mg total) by mouth daily. 90 tablet 3   lisinopril (PRINIVIL,ZESTRIL) 10 MG tablet Take 10 mg by mouth daily.     nitroGLYCERIN (NITROSTAT) 0.4 MG SL tablet Place 0.4 mg under the tongue every 5 (five) minutes as needed for chest pain.     ondansetron (ZOFRAN-ODT) 4 MG disintegrating tablet Take 1 tablet (4 mg total) by mouth every 8 (eight) hours as needed for nausea or vomiting. 20 tablet 0   pyridOXINE (VITAMIN B-6) 100 MG tablet Take 100 mg by mouth daily.     tamsulosin (FLOMAX) 0.4 MG CAPS capsule Take 0.4 mg by mouth daily after supper.     No current facility-administered medications for this visit.    Allergies:   Hydrochlorothiazide    Social History:  The patient  reports that he has never smoked. He has never used smokeless tobacco. He reports that he does not drink alcohol and does not use drugs.   Family History:  The patient's is negative for premature CAD.   ROS:  Please see the history of present illness.   Otherwise, review of systems are positive for none.   All other systems are reviewed and negative.    PHYSICAL EXAM: VS:  BP (!) 114/50 (BP Location: Left Arm, Patient Position: Sitting, Cuff Size: Normal)   Pulse (!) 51   Ht 6' (1.829 m)    Wt 157 lb (71.2 kg)   BMI 21.29 kg/m  , BMI Body mass index is 21.29 kg/m. GEN: Well nourished, well developed, in no acute distress HEENT: normal Neck: no JVD, carotid bruits, or masses Cardiac: RRR; no murmurs, rubs, or gallops,no edema  Respiratory:  clear to auscultation bilaterally, normal work of breathing GI: soft, nontender, nondistended, + BS MS: no deformity or atrophy. Minimal bruising at cath site. Skin: warm and dry, no rash Neuro:  Strength and sensation are intact Psych: euthymic mood, full affect   EKG:  EKG is not ordered today.  Recent Labs: 05/09/2022: ALT 24; BUN 27; Creatinine, Ser 1.36; Hemoglobin 13.5; Platelets 170; Potassium 5.0; Sodium 144    Lipid Panel    Component Value Date/Time   CHOL 138 05/09/2022 0834   TRIG 71 05/09/2022 0834   HDL 51 05/09/2022 0834   CHOLHDL 2.7 05/09/2022 0834   LDLCALC 73 05/09/2022 0834      Wt Readings from Last 3 Encounters:  06/02/22 157 lb (71.2 kg)  05/15/22 160 lb (72.6 kg)  05/09/22 158 lb (71.7 kg)      Other studies Reviewed: Additional studies/ records that were reviewed today include:  CORONARY STENT INTERVENTION   Conclusion      Ost Cx lesion is 40% stenosed.   Prox Cx lesion is 80% stenosed.   Mid Cx lesion is 40% stenosed.   A drug-eluting stent was successfully placed using a SYNERGY XD 3.0X12.   Post intervention, there is a 0% residual stenosis.   Successful PCI of the proximal LCx with DES   Plan: DAPT for 6 months. Anticipate same day DC.   Diagnostic Dominance: Right Intervention     Cardiac cath 05/15/22:  Coronary Pressure Wire/FFR w/3D Mapping  LEFT HEART CATH AND CORONARY ANGIOGRAPHY   Conclusion      Mid Cx lesion is 40% stenosed.   Ost Cx lesion is 40% stenosed.   Prox LAD to Mid LAD lesion is 60% stenosed.   Mid RCA to Dist RCA lesion is 100% stenosed.   Non-stenotic Prox Cx lesion was previously treated.   LIMA graft was visualized by angiography and is  normal in caliber.   SVG graft was visualized by angiography and is normal in caliber.   The graft exhibits no disease.   The graft exhibits no disease.   The left ventricular systolic function is normal.   LV end diastolic pressure is normal.   The left ventricular ejection fraction is 55-65% by visual estimate.   2 vessel obstructive CAD.  Patent LIMA to the LAD Patent SVG to distal RCA Patent stent in the proximal LCx. The ostial disease is not hemodynamically significant with RFR 0.99 Normal LV function Normal LVEDP   Plan: continue medical therapy. Will have to consider other causes of dyspnea.   Coronary Diagrams  Diagnostic Dominance: Right  Intervention  Implants     No implant documentation for this case.   Syngo Images   Show images for CARDIAC CATHETERIZATION Images on Long Term Storage   Show images for Patrich, Heinze to Procedure Log  Procedure Log    Hemo Data  Flowsheet Row Most Recent Value  AO Systolic Pressure 970 mmHg  AO Diastolic Pressure 49 mmHg  AO Mean 86 mmHg  LV Systolic Pressure 263 mmHg  LV Diastolic Pressure -3 mmHg  LV EDP 5 mmHg  AOp Systolic Pressure 785 mmHg  AOp Diastolic Pressure 51 mmHg  AOp Mean Pressure 84 mmHg  LVp Systolic Pressure 885 mmHg  LVp Diastolic Pressure -6 mmHg  LVp EDP Pressure 2 mmHg       ASSESSMENT AND PLAN:  1.  CAD: s/p CABG in 2019 with SVG-RCA, LIMA to  LAD and Free RIMA Y graft to OM branch. LIMA to the LAD is patent as is SVG to RCA. No visualization of Y graft to OM. The native LCx demonstrates a 80% stenosis proximally. There is modest calcification. Prior Echo showed normal LV function with EF 60%. There is mild TR and mild biatrial enlargement. No effusion.  Now s/p  stenting of native LCx in January initially with marked improvement in symptoms.  - repeat cardiac cath showed patent LCx stent. Patent LIMA to LAD and SVG to RCA. Normal LVEDP and normal LV function. Nothing to explain  symptoms of SOB -Continue aspirin, statin, Plavix -on low dose Coreg -As needed sublingual nitroglycerin   2. Hypertension: On lisinopril 10 mg daily, carvedilol 3.125 mg twice daily.  Well controlled.   3. Hyperlipidemia: Continue atorvastatin 40 mg daily. Will check labs today.    4. Dyspnea. No cardiac reason for symptoms. Will check CXR. Add Breztri 2 puffs bid and albuterol inhaler prn. If symptoms do not improve in next couple of weeks will need pulmonary evaluation.    Disposition:   cardiac cath on Monday August 14   Signed, Kunio Cummiskey Martinique, MD  06/02/2022 9:23 AM    [facility] [address] [place], [place], [address] Phone [phone], Fax [phone]

## 2022-06-01 ENCOUNTER — Ambulatory Visit: Payer: No Typology Code available for payment source | Admitting: Surgical

## 2022-06-02 ENCOUNTER — Telehealth: Payer: Self-pay | Admitting: Cardiology

## 2022-06-02 ENCOUNTER — Ambulatory Visit
Admission: RE | Admit: 2022-06-02 | Discharge: 2022-06-02 | Disposition: A | Discharge status: Home / Self Care | Payer: No Typology Code available for payment source | Source: Ambulatory Visit | Attending: Cardiology | Admitting: Cardiology

## 2022-06-02 ENCOUNTER — Ambulatory Visit: Payer: No Typology Code available for payment source | Attending: Cardiology | Admitting: Cardiology

## 2022-06-02 ENCOUNTER — Encounter: Payer: Self-pay | Admitting: Cardiology

## 2022-06-02 VITALS — BP 114/50 | HR 51 | Ht 72.0 in | Wt 157.0 lb

## 2022-06-02 DIAGNOSIS — I25118 Atherosclerotic heart disease of native coronary artery with other forms of angina pectoris: Secondary | ICD-10-CM | POA: Diagnosis not present

## 2022-06-02 DIAGNOSIS — E785 Hyperlipidemia, unspecified: Secondary | ICD-10-CM

## 2022-06-02 DIAGNOSIS — I10 Essential (primary) hypertension: Secondary | ICD-10-CM

## 2022-06-02 DIAGNOSIS — Z955 Presence of coronary angioplasty implant and graft: Secondary | ICD-10-CM

## 2022-06-02 DIAGNOSIS — R0609 Other forms of dyspnea: Secondary | ICD-10-CM

## 2022-06-02 MED ORDER — BREZTRI AEROSPHERE 160-9-4.8 MCG/ACT IN AERO: 2.0000 | INHALATION_SPRAY | Freq: Two times a day (BID) | RESPIRATORY_TRACT | 3 refills | Status: DC

## 2022-06-02 MED ORDER — SPIRIVA RESPIMAT 2.5 MCG/ACT IN AERS: 2.0000 | INHALATION_SPRAY | Freq: Every day | RESPIRATORY_TRACT | 6 refills | Status: AC

## 2022-06-02 MED ORDER — ALBUTEROL SULFATE HFA 108 (90 BASE) MCG/ACT IN AERS: 2.0000 | INHALATION_SPRAY | Freq: Four times a day (QID) | RESPIRATORY_TRACT | 2 refills | Status: AC | PRN

## 2022-06-02 NOTE — Telephone Encounter (Signed)
Spoke to patient he stated [facility] does not have Breztri inhaler. Left message on patient's personal voice mail to find out what inhaler is on his formulary.

## 2022-06-02 NOTE — Patient Instructions (Signed)
We will try inhaler therapy with Breztri 2 puffs twice a day and albuterol inhaler 2 puffs as needed every 4-6 hours  We will check a chest Xray

## 2022-06-02 NOTE — Telephone Encounter (Signed)
[facility] Spiriva [facility].Prescription sent to [facility] in [place].

## 2022-06-02 NOTE — Addendum Note (Signed)
Addended by: Kathyrn Lass on: 06/02/2022 12:06 PM   Modules accepted: Orders

## 2022-06-02 NOTE — Telephone Encounter (Signed)
Pt c/o medication issue:  1. Name of Medication:    2. How are you currently taking this medication (dosage and times per day)?    3. Are you having a reaction (difficulty breathing--STAT)? no  4. What is your medication issue? Patient states the meds he was told to get a [place]. He is unable to. Calling to see what other option is there. Please advise

## 2022-06-04 ENCOUNTER — Emergency Department (HOSPITAL_COMMUNITY): Payer: No Typology Code available for payment source

## 2022-06-04 ENCOUNTER — Encounter (HOSPITAL_COMMUNITY): Payer: Self-pay

## 2022-06-04 ENCOUNTER — Emergency Department (HOSPITAL_COMMUNITY)
Admission: EM | Admit: 2022-06-04 | Discharge: 2022-06-04 | Disposition: A | Discharge status: Home / Self Care | Payer: No Typology Code available for payment source | Attending: Emergency Medicine | Admitting: Emergency Medicine

## 2022-06-04 ENCOUNTER — Other Ambulatory Visit: Payer: Self-pay

## 2022-06-04 DIAGNOSIS — S0990XA Unspecified injury of head, initial encounter: Secondary | ICD-10-CM

## 2022-06-04 DIAGNOSIS — Z7982 Long term (current) use of aspirin: Secondary | ICD-10-CM | POA: Insufficient documentation

## 2022-06-04 DIAGNOSIS — W010XXA Fall on same level from slipping, tripping and stumbling without subsequent striking against object, initial encounter: Secondary | ICD-10-CM | POA: Diagnosis not present

## 2022-06-04 DIAGNOSIS — Z7902 Long term (current) use of antithrombotics/antiplatelets: Secondary | ICD-10-CM | POA: Diagnosis not present

## 2022-06-04 DIAGNOSIS — S40011A Contusion of right shoulder, initial encounter: Secondary | ICD-10-CM | POA: Insufficient documentation

## 2022-06-04 DIAGNOSIS — S060X9A Concussion with loss of consciousness of unspecified duration, initial encounter: Secondary | ICD-10-CM

## 2022-06-04 DIAGNOSIS — S51011A Laceration without foreign body of right elbow, initial encounter: Secondary | ICD-10-CM | POA: Diagnosis not present

## 2022-06-04 DIAGNOSIS — Y92512 Supermarket, store or market as the place of occurrence of the external cause: Secondary | ICD-10-CM | POA: Insufficient documentation

## 2022-06-04 DIAGNOSIS — S060XAA Concussion with loss of consciousness status unknown, initial encounter: Secondary | ICD-10-CM | POA: Diagnosis present

## 2022-06-04 HISTORY — DX: Squamous cell carcinoma of skin, unspecified: C44.92

## 2022-06-04 MED ORDER — ACETAMINOPHEN 325 MG PO TABS
650.0000 mg | ORAL_TABLET | Freq: Once | ORAL | Status: AC
  Administered 2022-06-04: 650 mg via ORAL
  Filled 2022-06-04: qty 2

## 2022-06-04 NOTE — Discharge Instructions (Addendum)
You were diagnosed today with a concussion.  Most people recover from a concussion in 2 to 4 weeks.  Concussion symptoms could include brain fog, headache, memory difficulty, short-term memory loss, nausea, balance problems.  These symptoms should gradually improved.  In the meantime, you should not drive a car unless you feel completely back to normal.  X-ray of your shoulder and a CT scan of your brain did not show any signs of broken bones or brain bleeding.  You can wash the small skin tear on your elbow with regular soap and water, or apply bacitracin or Neosporin twice a day for the next 5 days.  *  Please call your doctor's office tomorrow or on Tuesday to schedule a follow-up appointment as soon as possible.  This could also be at the [place] should be reassessed within 1 to 2 weeks to see how you are doing with your concussion symptoms.

## 2022-06-04 NOTE — ED Notes (Signed)
Pt verbalized understanding of discharge paperwork and follow-up care. Pt called friend for a ride and requested to ambulate to waiting room.

## 2022-06-04 NOTE — ED Triage Notes (Signed)
Patient arrived to ED by [facility]. Patient slipped and fell at [employer] about a hour before arrival. States he doesn't remember the fall that he passed out but remembers being picked up out of the floor. He drove home and got dizzy at home and drove back to the store and EMS was called.  Patient takes plavix and carvedilol. He has bruies everywhere and a skin tear. Most bruising is reported as old te new bruises from the fall are on his Right knee and Right eye. He also has a skin tear from the fall on his right forearm. Denies neck or back pain.

## 2022-06-04 NOTE — Progress Notes (Signed)
Chaplain responded to Level 2 fall. Patient sitting up and said "I don't need a chaplain" with a smile, indicating he was OK.   Rev. Tamsen Snider Pager [phone]

## 2022-06-04 NOTE — ED Notes (Signed)
Patient transported to CT 

## 2022-06-04 NOTE — ED Provider Notes (Signed)
[facility] EMERGENCY DEPARTMENT Provider Note   CSN: [id number] Arrival date & time: 06/04/22  1138     History  Chief Complaint  Patient presents with   [name] is a 76 y.o. male on plavix here with fall and forehead injury, slipped at store and struck forehead on ground, possible LOC.  HPI     Home Medications Prior to Admission medications   Medication Sig Start Date End Date Taking? Authorizing Provider  albuterol (VENTOLIN HFA) 108 (90 Base) MCG/ACT inhaler Inhale 2 puffs into the lungs every 6 (six) hours as needed for wheezing or shortness of breath. 06/02/22   Martinique, Peter M, MD  aspirin 81 MG EC tablet Take 81 mg by mouth daily. 09/29/19   [provider]  atorvastatin (LIPITOR) 20 MG tablet Take 20 mg by mouth every evening. 01/23/18   [provider]  carvedilol (COREG) 3.125 MG tablet Take 1 tablet (3.125 mg total) by mouth 2 (two) times daily. 10/10/21   Donato Heinz, MD  clopidogrel (PLAVIX) 75 MG tablet Take 1 tablet (75 mg total) by mouth daily. 10/19/21   Martinique, Peter M, MD  lisinopril (PRINIVIL,ZESTRIL) 10 MG tablet Take 10 mg by mouth daily.    [provider]  nitroGLYCERIN (NITROSTAT) 0.4 MG SL tablet Place 0.4 mg under the tongue every 5 (five) minutes as needed for chest pain.    [provider]  ondansetron (ZOFRAN-ODT) 4 MG disintegrating tablet Take 1 tablet (4 mg total) by mouth every 8 (eight) hours as needed for nausea or vomiting. 03/07/22   Lennice Sites, MD  pyridOXINE (VITAMIN B-6) 100 MG tablet Take 100 mg by mouth daily.    [provider]  tamsulosin (FLOMAX) 0.4 MG CAPS capsule Take 0.4 mg by mouth daily after supper.    [provider]  Tiotropium Bromide Monohydrate (SPIRIVA RESPIMAT) 2.5 MCG/ACT AERS Inhale 2 puffs into the lungs daily. 06/02/22   Martinique, Peter M, MD      Allergies    Hydrochlorothiazide    Review of Systems   Review of  Systems  Physical Exam Updated Vital Signs BP (!) 158/56   Pulse (!) 51   Temp 98 F (36.7 C)   Resp 18   Ht 6' (1.829 m)   Wt 71.7 kg   SpO2 100%   BMI 21.43 kg/m  Physical Exam Constitutional:      General: He is not in acute distress. HENT:     Head: Normocephalic.     Comments: Hematoma to forehead Eyes:     Conjunctiva/sclera: Conjunctivae normal.     Pupils: Pupils are equal, round, and reactive to light.  Cardiovascular:     Rate and Rhythm: Normal rate and regular rhythm.  Pulmonary:     Effort: Pulmonary effort is normal. No respiratory distress.  Abdominal:     General: There is no distension.     Tenderness: There is no abdominal tenderness.  Musculoskeletal:     Comments: Contusion to right shoulder, full ROM  Skin:    General: Skin is warm and dry.     Comments: Small 1 cm skin tear near right elbow  Neurological:     General: No focal deficit present.     Mental Status: He is alert. Mental status is at baseline.  Psychiatric:        Mood and Affect: Mood normal.        Behavior: Behavior normal.  ED Results / Procedures / Treatments   Labs (all labs ordered are listed, but only abnormal results are displayed) Labs Reviewed - No data to display  EKG None  Radiology CT Head Wo Contrast  Result Date: 06/04/2022 CLINICAL DATA:  Head trauma, moderate-severe fall forehead injury, on plavix EXAM: CT HEAD WITHOUT CONTRAST TECHNIQUE: Contiguous axial images were obtained from the base of the skull through the vertex without intravenous contrast. RADIATION DOSE REDUCTION: This exam was performed according to the departmental dose-optimization program which includes automated exposure control, adjustment of the mA and/or kV according to patient size and/or use of iterative reconstruction technique. COMPARISON:  None Available. FINDINGS: Brain: No evidence of acute intracranial hemorrhage or extra-axial collection. No loss of gray-white matter  differentiation.Patent basal cisterns.The ventricles are normal in size.Scattered subcortical and periventricular white matter hypodensities, nonspecific but likely sequela of chronic small vessel ischemic disease. Vascular: Vascular calcifications.  No hyperdense vessel. Skull: Negative for skull fracture. Sinuses/Orbits: Mild ethmoid air cell mucosal thickening. Mastoid air cells are clear. Orbits are unremarkable. Other: Mild focal right facial soft tissue swelling. IMPRESSION: No acute intracranial abnormality. Mild sequela of chronic small vessel ischemic disease. Electronically Signed   By: Maurine Simmering M.D.   On: 06/04/2022 12:59   DG Shoulder Right Portable  Result Date: 06/04/2022 CLINICAL DATA:  Fall. EXAM: RIGHT SHOULDER - 1 VIEW COMPARISON:  None Available. FINDINGS: No evidence for an acute fracture. No subluxation or dislocation. No evidence for shoulder dislocation. Sequelae of prior rotator cuff repair evident. IMPRESSION: Negative. Electronically Signed   By: Misty Stanley M.D.   On: 06/04/2022 12:07    Procedures Procedures    Medications Ordered in ED Medications  acetaminophen (TYLENOL) tablet 650 mg (650 mg Oral Given 06/04/22 1321)    ED Course/ Medical Decision Making/ A&P                           Medical Decision Making Amount and/or Complexity of Data Reviewed Radiology: ordered.  Risk OTC drugs.   Mechanical fall at store today [facility] and xray shoulder ordered and personally interpreted, notable for no acute traumatic injury.  Vitals otherwise at baseline for patient - doubt arrhythmia, anemia, ACS, PE, sepsis.  I do not see indication for bloodwork or additional trauma imaging at this time.  Skin tear can be cleaned and dressed here.    He has old stitches in both forearms that will need to be removed later this week - they are too fresh and the wounds are still healing.  Patient symptoms are consistent with a mild to moderate concussion.  He has some mild  memory deficits.  He lives with a close friend, who is coming to pick him up.  Written precautions were provided, advised he do not drive until he feels back to himself, discussed what to expect over the next 2-4 weeks with concussion symptoms.  He verbalized understanding.  Okay for discharge        Final Clinical Impression(s) / ED Diagnoses Final diagnoses:  Injury of head, initial encounter  Concussion with loss of consciousness, initial encounter  Contusion of right shoulder, initial encounter  Skin tear of right elbow without complication, initial encounter    Rx / DC Orders ED Discharge Orders     None         Wyvonnia Dusky, MD 06/04/22 1351

## 2022-06-04 NOTE — Progress Notes (Signed)
Orthopedic Tech Progress Note Patient Details:  [name] [birth year] [id number]  Level 2 trauma Patient ID: [name], male   DOB: [birth year], 76 y.o.   MRN: [id number]  [name] 06/04/2022, 11:52 AM

## 2022-06-08 ENCOUNTER — Other Ambulatory Visit: Payer: Self-pay

## 2022-06-08 DIAGNOSIS — R0609 Other forms of dyspnea: Secondary | ICD-10-CM

## 2022-06-19 ENCOUNTER — Other Ambulatory Visit (HOSPITAL_BASED_OUTPATIENT_CLINIC_OR_DEPARTMENT_OTHER): Payer: Self-pay

## 2022-07-05 ENCOUNTER — Institutional Professional Consult (permissible substitution): Payer: No Typology Code available for payment source | Admitting: Pulmonary Disease

## 2022-07-11 ENCOUNTER — Encounter: Payer: Self-pay | Admitting: Cardiology

## 2022-07-14 IMAGING — CT CT NECK W/ CM
3 of 5 series · 11 of 33 positions shown, 13 images · IV contrast (APPLIED)
Comparison: No pertinent prior exams available for comparison.

CLINICAL DATA: Provided history: Firm lymph node. Additional
history provided by scanning technologist: Patient reports a history
of colon cancer and recent diagnosis of skin cancer. Firm lymph node
near right carotid bulb.

EXAM:
CT NECK WITH CONTRAST
TECHNIQUE: Multidetector CT imaging of the neck was performed using the
standard protocol following the bolus administration of intravenous
contrast.

[Series 4: cor neck · coronal · 0.61mm/px · 3 of 118 slices shown]
[im 32/118  bone]
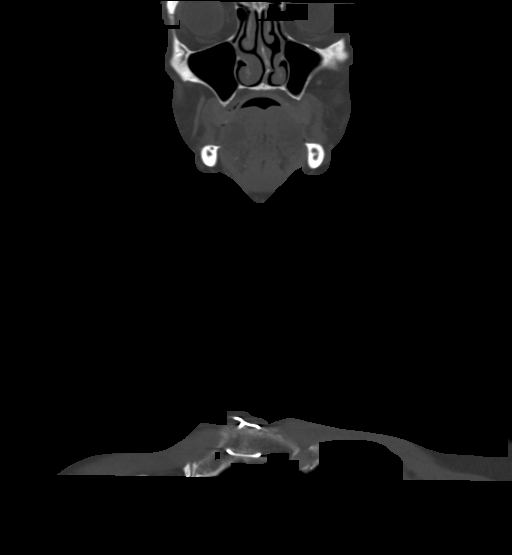
[im 50/118  bone]
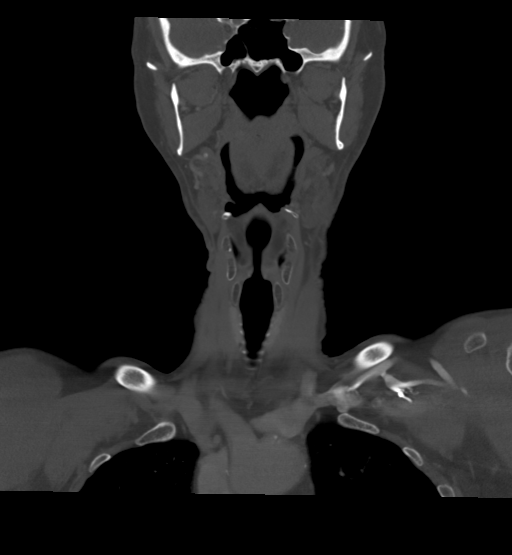
[im 68/118  bone]
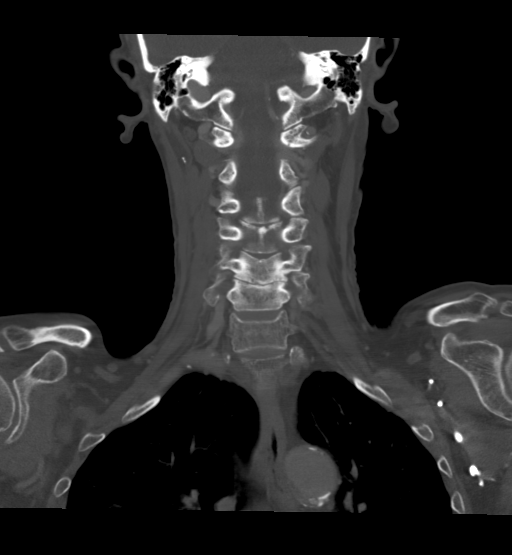

[Series 5: sag neck · sagittal · 0.46mm/px · 5 of 133 slices shown, 6 images]
[im 45/133  bone]
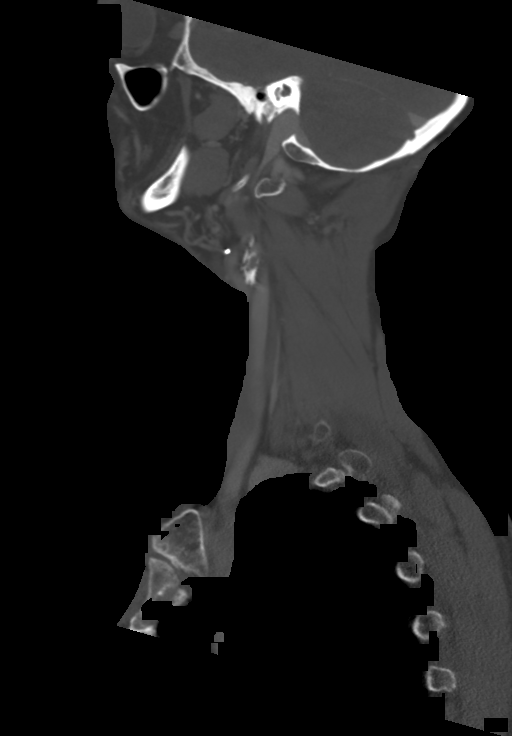
[im 56/133  bone]
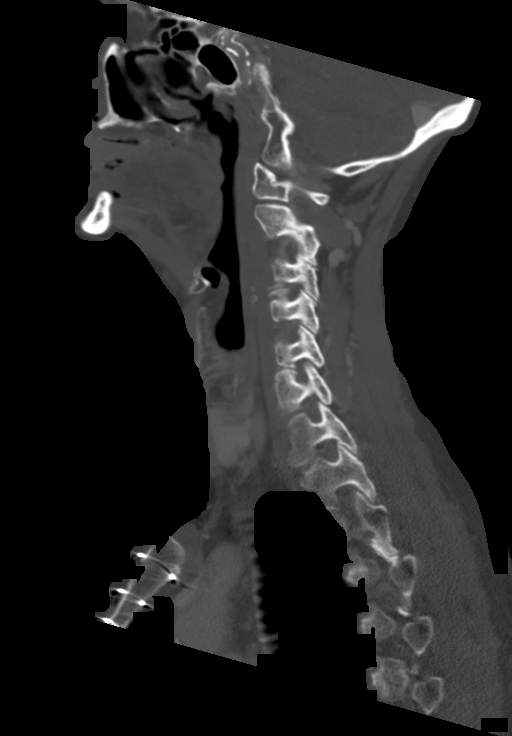
[im 67/133  soft-tissue]
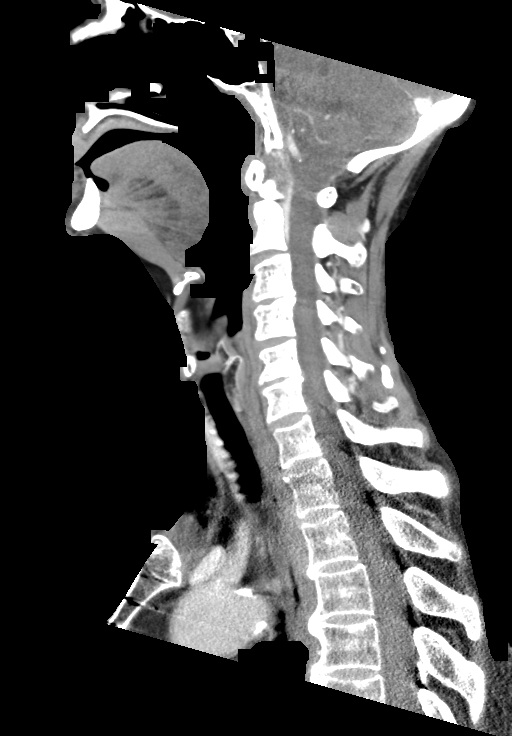
[im 67/133  bone]
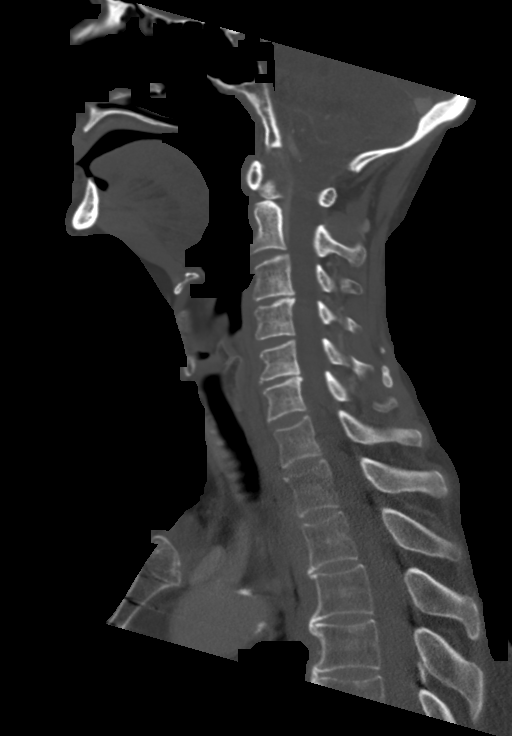
[im 78/133  bone]
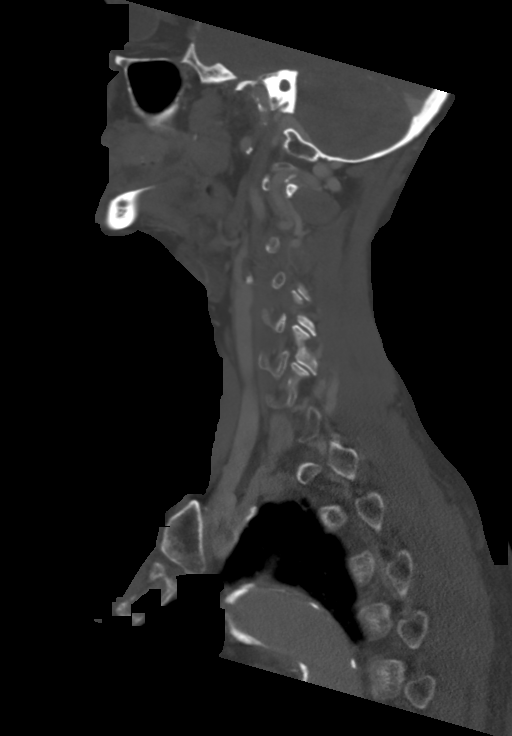
[im 89/133  bone]
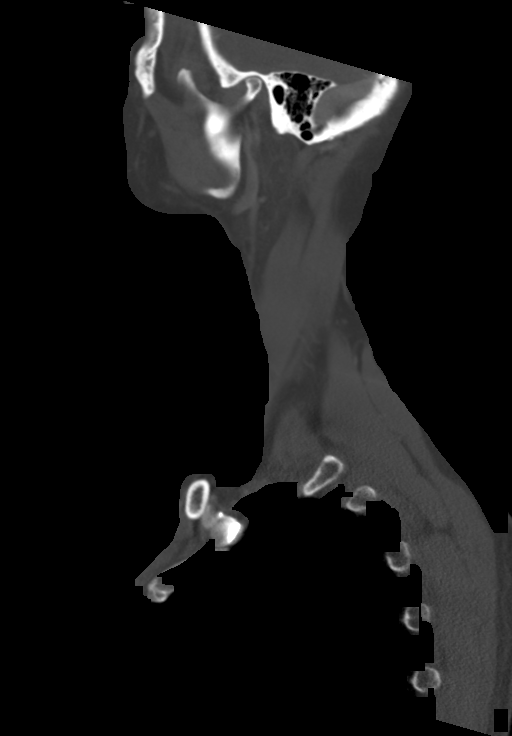

[Series 6: ax oropharynx · axial · 0.46mm/px · z∈[+692,+853]mm · 3 of 170 slices shown, 4 images]
[im 43/170  soft-tissue]
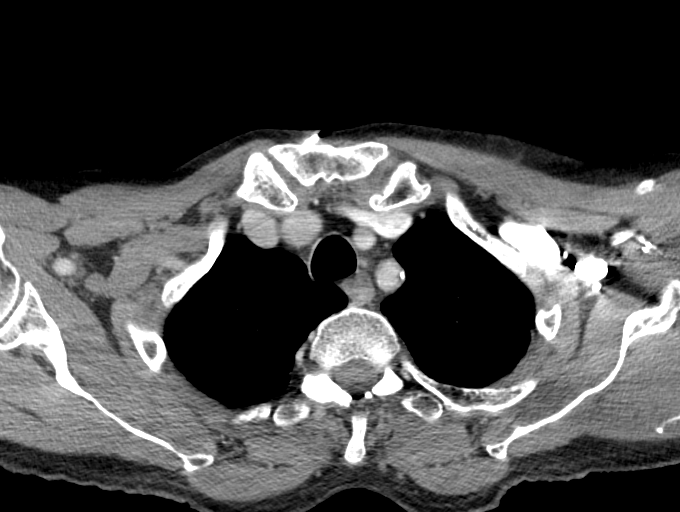
[im 43/170  bone]
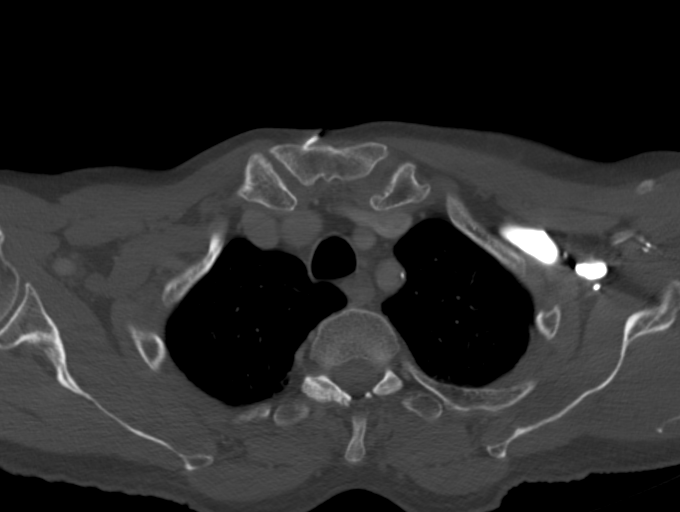
[im 85/170  bone]
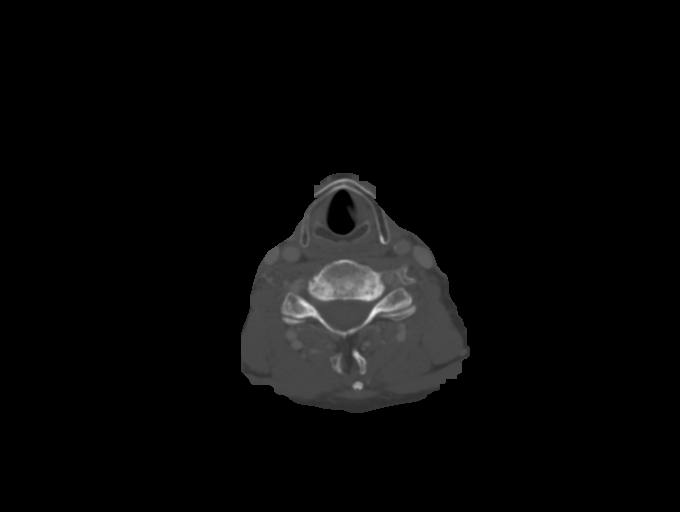
[im 127/170  bone]
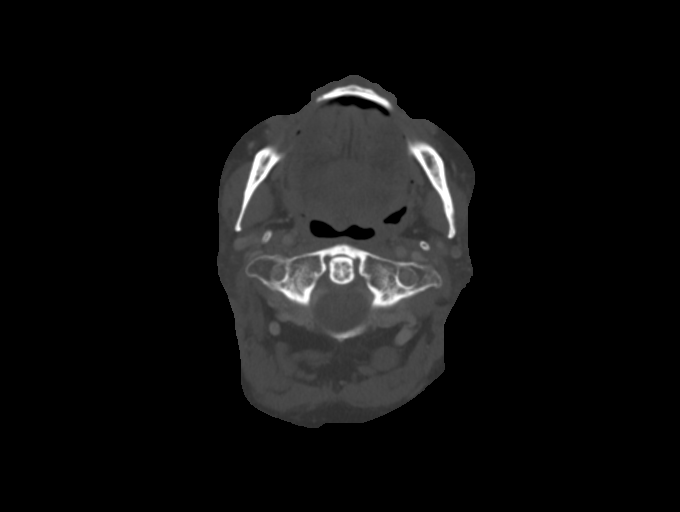

[11 of 33 positions shown; findings below may reference images not displayed]

RADIATION DOSE REDUCTION: This exam was performed according to the
departmental dose-optimization program which includes automated
exposure control, adjustment of the mA and/or kV according to
patient size and/or use of iterative reconstruction technique.

CONTRAST:  60mL OMNIPAQUE IOHEXOL 300 MG/ML  SOLN
FINDINGS: Pharynx and larynx: No appreciable swelling or discrete mass within
the oral cavity, pharynx or larynx.

Salivary glands: No inflammation, mass, or stone.

Thyroid: Unremarkable.

Lymph nodes: No pathologically enlarged lymph node is identified
within the neck.

Vascular: Calcified atherosclerotic plaque within the visualized
aortic arch, proximal major branch vessels of the neck, as well as
carotid and vertebral arteries. Atherosclerotic plaque about the
carotid bifurcations and within the proximal internal carotid
arteries could result in a hemodynamically significant stenoses,
bilaterally.

Limited intracranial: No appreciable acute intracranial abnormality
within the field of view.

Visualized orbits: Incompletely imaged. No orbital mass or acute
orbital finding at the imaged levels.

Mastoids and visualized paranasal sinuses: No significant paranasal
sinus disease or mastoid effusion at the imaged levels.

Skeleton: Cervical spondylosis. No acute bony abnormality or
aggressive osseous lesion.

Upper chest: Prior median sternotomy. No consolidation within the
imaged lung apices.

Other: Nonspecific focus of skin thickening and subcutaneous
induration/stranding within the right perimandibular soft tissues,
measuring 2.9 cm (for instance as seen on series 5, image 43)
(series 4, image 81) (series 2, image 54). Multiple surgical clips
within the right neck.
IMPRESSION: No pathologically enlarged lymph node is identified within the neck.

Nonspecific 2.9 cm focus of skin thickening and subcutaneous
induration/stranding within the right perimandibular soft tissues.
Direct visualization is recommended in this patient with a reported
history of skin cancer.

Multiple surgical clips within the right neck. Correlate with the
operative history.

Atherosclerotic plaque within the carotid bifurcations and proximal
internal carotid arteries, which may result in hemodynamically
significant stenoses, bilaterally. A carotid artery duplex is
recommended for further evaluation.

Aortic Atherosclerosis (CK1X9-ZOT.T).

Cervical spondylosis.

## 2022-07-19 ENCOUNTER — Telehealth: Payer: Self-pay | Admitting: Cardiology

## 2022-07-19 ENCOUNTER — Institutional Professional Consult (permissible substitution): Payer: No Typology Code available for payment source | Admitting: Pulmonary Disease

## 2022-07-19 NOTE — Telephone Encounter (Signed)
[name] from [facility] called in stating that pt was told we would be handling the [place] authorization for him to be seen at pulmonary.   Best callback - [phone]

## 2022-07-19 NOTE — Progress Notes (Deleted)
Synopsis: Referred in 07/2022 for Dyspnea by Dr. Peter Martinique.  He has an extensive past medical history significant for coronary artery disease.  He had a coronary artery bypass graft in 2019 at [facility].  He was referred to Dr. Peter Martinique in late 2022 for shortness of breath.  He had an elective stent placed in his left circumflex in January 2023.  He continued to have dyspnea so he was referred to [place] for further evaluation.  Subjective:   PATIENT ID: [name] GENDER: male DOB: 18-Jul-1946, MRN: [id number]   HPI  No chief complaint on file.   *** Record review: 2023 cardiology records reviewed with Dr. Peter Martinique, see my summary above.  His last heart catheterization was in August 2023 which showed patent stenting of the left circumflex artery in addition to various bypass grafts.  He was started on Breztri and albuterol and referred to [place] by Dr. 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   Social History   Socioeconomic History   Marital status: Single    Spouse name: Not on file   Number of children: Not on file   Years of education: Not on file   Highest education level: Not on file  Occupational History   Not on file  Tobacco Use   Smoking status: Never   Smokeless tobacco: Never  Substance and Sexual Activity   Alcohol use: Never   Drug use: Never   Sexual activity: Not on file  Other Topics Concern   Not on file  Social History Narrative   Left handed   One story home    Drinks no cafffeine   Social Determinants of Health   Financial Resource Strain: Not on file  Food Insecurity: Not on file  Transportation Needs: Not on file  Physical Activity: Not on file  Stress: Not on file  Social  Connections: Not on file  Intimate Partner Violence: Not on file     Allergies  Allergen Reactions   Hydrochlorothiazide Other (See Comments)    Gout     Outpatient Medications Prior to Visit  Medication Sig Dispense Refill   albuterol (VENTOLIN HFA) 108 (90 Base) MCG/ACT inhaler Inhale 2 puffs into the lungs every 6 (six) hours as needed for wheezing or shortness of breath. 8 g 2   aspirin 81 MG EC tablet Take 81 mg by mouth daily.     atorvastatin (LIPITOR) 20 MG tablet Take 20 mg by mouth every evening.     carvedilol (COREG) 3.125 MG tablet Take 1 tablet (3.125 mg total) by mouth 2 (two) times daily. 180 tablet 3   clopidogrel (PLAVIX) 75 MG tablet Take 1 tablet (75 mg total) by mouth daily. 90 tablet 3   lisinopril (PRINIVIL,ZESTRIL) 10 MG tablet Take 10 mg by mouth daily.     nitroGLYCERIN (NITROSTAT) 0.4 MG SL tablet Place 0.4 mg under the tongue every 5 (five) minutes as needed for chest pain.     ondansetron (ZOFRAN-ODT) 4 MG disintegrating tablet Take 1 tablet (4 mg total) by mouth every 8 (eight) hours as needed for nausea or vomiting. 20 tablet 0  pyridOXINE (VITAMIN B-6) 100 MG tablet Take 100 mg by mouth daily.     tamsulosin (FLOMAX) 0.4 MG CAPS capsule Take 0.4 mg by mouth daily after supper.     Tiotropium Bromide Monohydrate (SPIRIVA RESPIMAT) 2.5 MCG/ACT AERS Inhale 2 puffs into the lungs daily. 1 each 6   No facility-administered medications prior to visit.    ROS    Objective:  Physical Exam   There were no vitals filed for this visit.  ***  CBC    Component Value Date/Time   WBC 4.9 05/09/2022 0834   WBC 4.2 04/28/2022 0852   RBC 4.30 05/09/2022 0834   RBC 4.12 (L) 04/28/2022 0852   HGB 13.5 05/09/2022 0834   HGB 14.0 07/30/2007 0815   HCT 40.3 05/09/2022 0834   HCT 40.2 07/30/2007 0815   PLT 170 05/09/2022 0834   MCV 94 05/09/2022 0834   MCV 87.9 07/30/2007 0815   MCH 31.4 05/09/2022 0834   MCH 31.1 04/28/2022 0852   MCHC 33.5 05/09/2022  0834   MCHC 32.4 04/28/2022 0852   RDW 13.8 05/09/2022 0834   RDW 15.8 (H) 07/30/2007 0815   LYMPHSABS 1.1 05/09/2022 0834   LYMPHSABS 1.0 07/30/2007 0815   MONOABS 0.4 07/30/2007 0815   EOSABS 0.1 05/09/2022 0834   BASOSABS 0.0 05/09/2022 0834   BASOSABS 0.0 07/30/2007 0815     Chest imaging: September 2023 chest x-ray images independently reviewed showing hyperinflation, normal cardiac silhouette  PFT:  Labs:  Path:  Echo:  Heart Catheterization: August 2023 2 vessel obstructive CAD Patent LIMA to the LAD Patent SVG to distal RCA Patent stent in the proximal LCx. The ostial disease is not hemodynamically significant with RFR 0.99 Normal LV function Normal LVEDP      Assessment & Plan:   No diagnosis found.  Discussion: ***  Immunizations: Immunization History  Administered Date(s) Administered   PFIZER Comirnaty(Gray Top)Covid-19 Tri-Sucrose Vaccine 01/04/2021   PFIZER(Purple Top)SARS-COV-2 Vaccination 10/29/2019, 11/18/2019, 06/09/2020   Pfizer Covid-19 Vaccine Bivalent Booster 53yr & up 06/16/2021     Current Outpatient Medications:    albuterol (VENTOLIN HFA) 108 (90 Base) MCG/ACT inhaler, Inhale 2 puffs into the lungs every 6 (six) hours as needed for wheezing or shortness of breath., Disp: 8 g, Rfl: 2   aspirin 81 MG EC tablet, Take 81 mg by mouth daily., Disp: , Rfl:    atorvastatin (LIPITOR) 20 MG tablet, Take 20 mg by mouth every evening., Disp: , Rfl:    carvedilol (COREG) 3.125 MG tablet, Take 1 tablet (3.125 mg total) by mouth 2 (two) times daily., Disp: 180 tablet, Rfl: 3   clopidogrel (PLAVIX) 75 MG tablet, Take 1 tablet (75 mg total) by mouth daily., Disp: 90 tablet, Rfl: 3   lisinopril (PRINIVIL,ZESTRIL) 10 MG tablet, Take 10 mg by mouth daily., Disp: , Rfl:    nitroGLYCERIN (NITROSTAT) 0.4 MG SL tablet, Place 0.4 mg under the tongue every 5 (five) minutes as needed for chest pain., Disp: , Rfl:    ondansetron (ZOFRAN-ODT) 4 MG  disintegrating tablet, Take 1 tablet (4 mg total) by mouth every 8 (eight) hours as needed for nausea or vomiting., Disp: 20 tablet, Rfl: 0   pyridOXINE (VITAMIN B-6) 100 MG tablet, Take 100 mg by mouth daily., Disp: , Rfl:    tamsulosin (FLOMAX) 0.4 MG CAPS capsule, Take 0.4 mg by mouth daily after supper., Disp: , Rfl:    Tiotropium Bromide Monohydrate (SPIRIVA RESPIMAT) 2.5 MCG/ACT AERS, Inhale 2 puffs into the lungs  daily., Disp: 1 each, Rfl: 6

## 2022-07-25 ENCOUNTER — Telehealth: Payer: Self-pay | Admitting: Cardiology

## 2022-07-25 NOTE — Telephone Encounter (Signed)
This may be a duplicate message. Routed to primary nurse

## 2022-07-25 NOTE — Telephone Encounter (Signed)
See previous 10/24 telephone note.

## 2022-07-25 NOTE — Telephone Encounter (Signed)
Spoke to [place] with [place].She will be faxing a special request form to be completed and signed by Dr.Jordan before appointment can be scheduled with pulmonary.

## 2022-07-25 NOTE — Telephone Encounter (Signed)
Calling to say that they need the referral for the pulmonary dr to come to their office first. They need to verify that the pulmonary is cover under them. Info can be fax [phone]. Please advise

## 2022-07-25 NOTE — Telephone Encounter (Signed)
Routed to [facility] LPN and [facility] Billing/Insurance Auth for assistance

## 2022-07-25 NOTE — Telephone Encounter (Signed)
Patient states he needs to have Dr. Martinique to sent a referral to community care the [facility], referring him to [facility] health pulmonary.

## 2022-07-26 NOTE — Telephone Encounter (Signed)
Spoke to [place] with [place] I did receive form.Form completed and signed by Dr.Jordan.Form faxed back at fax # [phone].

## 2022-07-26 NOTE — Telephone Encounter (Signed)
Follow Up:      [name] is calling to see if you recived her fax from yesterday?e

## 2023-01-12 ENCOUNTER — Other Ambulatory Visit: Payer: Self-pay

## 2023-01-12 ENCOUNTER — Ambulatory Visit: Payer: No Typology Code available for payment source | Attending: Otolaryngology

## 2023-01-12 DIAGNOSIS — R1313 Dysphagia, pharyngeal phase: Secondary | ICD-10-CM | POA: Insufficient documentation

## 2023-01-12 DIAGNOSIS — R490 Dysphonia: Secondary | ICD-10-CM | POA: Diagnosis present

## 2023-01-12 NOTE — Therapy (Signed)
OUTPATIENT SPEECH LANGUAGE PATHOLOGY SWALLOW EVALUATION   Patient Name: JAVONNE DORKO MRN: [id number] DOB:1946/04/29, 77 y.o., male Today's Date: 01/12/2023  PCP: None in Epic REFERRING PROVIDER: Dorna Leitz, MD  END OF SESSION:  End of Session - 01/12/23 1819     Visit Number 1    Number of Visits 15    Date for SLP Re-Evaluation 03/09/23    Authorization Time Period until 07-11-23    Authorization - Number of Visits 15    SLP Start Time 0803    SLP Stop Time  0850    SLP Time Calculation (min) 47 min    Activity Tolerance Patient tolerated treatment well             Past Medical History:  Diagnosis Date   Arthritis    Cancer    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   Coronary artery disease    History of kidney stones    one time   Hypertension    Squamous cell skin cancer, multiple sites    Both arms   Past Surgical History:  Procedure Laterality Date   BROW LIFT Bilateral 05/03/2022   Procedure: LEFT EYELID BLEPHAROPLASTY WITH PTOSIS REPAIR AND RIGHT EYELID BLEPHAROPLASTY WITH POSSIBLE PTOSIS REPAIR;  Surgeon: Janne Napoleon, MD;  Location: [facility] OR;  Service: Plastics;  Laterality: Bilateral;  1.5 hours   CORONARY ARTERY BYPASS GRAFT  01/18/2018   CABG 01/18/18: SVG-PDA, free RIMA-OM1-LAD with inflow from the LIMA as a Y graft, replacement of ascending aorta and hemi arch using 24 mm Gelweave vascular graft   CORONARY PRESSURE/FFR WITH 3D MAPPING N/A 05/15/2022   Procedure: Coronary Pressure Wire/FFR w/3D Mapping;  Surgeon: Swaziland, Peter M, MD;  Location: [facility] INVASIVE CV LAB;  Service: Cardiovascular;  Laterality: N/A;   CORONARY STENT INTERVENTION N/A 10/24/2021   Procedure: CORONARY STENT INTERVENTION;  Surgeon: Swaziland, Peter M, MD;  Location: [facility] INVASIVE CV LAB;  Service: Cardiovascular;  Laterality: N/A;   HIP ARTHROPLASTY     BILAT   LEFT HEART CATH AND CORONARY ANGIOGRAPHY N/A 05/15/2022   Procedure: LEFT HEART CATH AND CORONARY ANGIOGRAPHY;  Surgeon: Swaziland,  Peter M, MD;  Location: [facility] INVASIVE CV LAB;  Service: Cardiovascular;  Laterality: N/A;   NECK MASS EXCISION     RT SIDE LYMPH NODE REMOVED   SHOULDER ARTHROSCOPY WITH SUBACROMIAL DECOMPRESSION AND OPEN ROTATOR C Left 08/13/2015   Procedure: LEFT SHOULDER ARTHROSCOPY WITH SUBACROMIAL DECOMPRESSION AND MINI OPEN ROTATOR CUFF REPAIR, BICEPS TENODESIS AND OPEN DISTAL CLAVICLE RESECTION;  Surgeon: Beverely Low, MD;  Location: [facility] OR;  Service: Orthopedics;  Laterality: Left;   TONSILLECTOMY     WRIST SURGERY     LEFT   Patient Active Problem List   Diagnosis Date Noted   Dyspnea on exertion 10/24/2021   Angina pectoris 10/24/2021   CAD of autologous bypass graft 10/24/2021   HTN (hypertension) 10/24/2021   Hypercholesterolemia 10/24/2021    ONSET DATE: 2012 after lymph node removal   REFERRING DIAG: dysphagia, pharyngoesophgeal  THERAPY DIAG:  Dysphagia, pharyngeal phase  Rationale for Evaluation and Treatment: Rehabilitation  SUBJECTIVE:   SUBJECTIVE STATEMENT: "They damaged my larynx (during the surgery), and they admitted it. They said I would just have to get used to it." Pt accompanied by: self  PERTINENT HISTORY:  From Dr. Larae Grooms note 01/05/23: HISTORY OF PRESENT ILLNESS: This is a 77 year old MALE veteran established patient who presents for  evaluation of issues with many years of swallowing difficulty that started  after  radiation to the head and neck at the [facility] in 2012. 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 He was cured in 2017 and has been without  development of second primary.  During my last visit with him 09/18/2022, he was also experiencing some  difficulty swallowing which was mainly on the right side. His main issue is thick throat mucous. There is no sneezing or itchy eyes/nose.     PAIN:  Are you having pain? No  FALLS: Has patient fallen in last 6 months?  No  LIVING ENVIRONMENT: Lives with: lives with their spouse Lives in: House/apartment  PLOF:  Level of assistance: Independent with ADLs, Independent with IADLs Employment: Retired  PATIENT GOALS: "Swallow again."  OBJECTIVE:   DIAGNOSTIC FINDINGS: See Dr. Gerilyn Pilgrim note, above.   COGNITION: Overall cognitive status: Within functional limits for tasks assessed  ORAL MOTOR EXAMINATION: Overall status: WFL Comments: Pt's labial margin appears very slightly drooped on the rt side.  VOICE:  Pt's voice was breathy and had either an aphonic or a high pitched quality. "I can't shout or it will break." Pt demonstrated this for SLP. Breathiness reduced and high-pitched quality was more consistent when pt attempted to increase volume to shouting level. Pt could not report exactly what occurred during his surgery excision but stated his voice has not been normal since prior to the surgery and also that his vocal quality has not changed over the years since surgery.  CLINICAL SWALLOW ASSESSMENT:   Current diet: regular and thin liquids; However pt endorses difficulty with pharyngeal clearance with more dense foods such as beef. During the evening when he wakes, he must shake his head in order to swallow in order to clear mucous/secretion build up. Dentition: adequate natural dentition Patient directly observed with POs: Yes: regular, dysphagia 3 (soft), dysphagia 1 (puree), and thin liquids  Feeding: able to feed self Liquids provided by: cup Oral phase signs and symptoms:  none noted Pharyngeal phase signs and symptoms: multiple swallows, immediate throat clear, and complaints of residue on rt side only. "The other side always moves right through." See "today's treatment" for more details. His swallowing ability has not improved nor declined since the surgery in 2012. SLP recommends objective swallow evaluation  (FEES or MBS) for diagnosing further difficulty, discovering any additional postures which may assist and/or food consistencies, and ascertain the need for swallow therapy.  PATIENT REPORTED OUTCOME MEASURES (PROM): EAT-10: to be provided next session   TODAY'S TREATMENT:                                                                                                                                         DATE:  01/12/23 (eval): Pt was educated today  on items below. SLP performed diagnostic therapy and skillfully discovered that when pt turns head to rt to essentially impede the rt pharyngeal channel pt's bolus passes without hindrance. SLP told pt to swallow food items he thinks are going to cause difficulty by turning his head to the right and then swallowing. Pt did so with applesauce and with peanut butter crackers with success. SLP also suggested pt alternate bite/sip for his meal, and begin meals with warm water.   PATIENT EDUCATION: Education details: eval findings, essence of MBS and FEES and rationale for an objective swallow evaluation, possible swallow therapy recommendation, basic pharyngeal anatomy-thyroud and hyoid structure Person educated: Patient Education method: Explanation, Demonstration, and Verbal cues Education comprehension: verbalized understanding and needs further education   ASSESSMENT:  CLINICAL IMPRESSION: Patient is a 77 y.o. male who was seen today for clinical assessment of swallowing as a result of surgery for neck cancer of unknown primary in 2012. Pt shared with SLP that [facility] told pt that he damaged his larynx during the surgery but did not bring paperwork today for SLP to ascertain what damage was done. Given pt's deficits SLP postulates nerve damage may have been one unforseen circumstance of his selective nodal dissection in 2012. Amontae told SLP his voice quality and his swallow ability have both remained static since 2012. Via diagnostic therapy today  with SLP, he was advised to turn head to rt to shut off impeded channel (right) and allow bolus to travel down the unimpaired channel (left).when he eats food he thinks may result in reduced pharyngeal clearance. SLP is also recommending objective/instrumental swallow eval to discover what other postures and/or diet changes that may reduce pt's risk of aspiration, as well as identify swallowing musculature that may benefit from swallowing therapy. Pt expressed some frustration with his voice and swallowing today and appeared interested to talk with referring MD about fhe benefit of returning to [facility] for ENT consult from surgeon who performed his surgery (or one of his colleagues) to assess pt's options for improved voice and/or swallowingd.  OBJECTIVE IMPAIRMENTS: include voice disorder and dysphagia. These impairments are limiting patient from household responsibilities, ADLs/IADLs, effectively communicating at home and in community, and safety when swallowing. Factors affecting potential to achieve goals and functional outcome are previous level of function and severity of impairments. Patient will benefit from skilled SLP services to address above impairments and improve overall function.  REHAB POTENTIAL: Good   GOALS: Goals reviewed with patient? Yes  SHORT TERM GOALS: Target date: 02/09/23  Pt will undergo instrumental assessment of swallowing if clinically appropriate Baseline: Goal status: INITIAL  2.  Pt will adhere to safe swallow precautions with POs with modified independence Baseline:  Goal status: INITIAL  3.  Pt will complete HEP for swallowing with rare min A in 2 sessions Baseline:  Goal status: INITIAL  4.   Pt will adhere to 1 vocal hygiene measures between 3 sessions Baseline:  Goal status: INITIAL  LONG TERM GOALS: Target date: 03/09/23  Pt will adhere to safe swallow precautions with independence Baseline:  Goal status: INITIAL  2.  Pt will adhere to 2 vocal  hygiene measures between 3 sessions Baseline:  Goal status: INITIAL  3.  Pt will report less frequent need to shake head at night in order to swallow than prior to ST Baseline:  Goal status: INITIAL  4.  Pt will perform HEP with modified independence between 3 sessions Baseline:  Goal status: INITIAL  5.  Pt will end ST with higher EAT-10 score than the initial score Baseline:  Goal status: INITIAL  PLAN:  SLP FREQUENCY: 2x/week  SLP DURATION: other: 7 weeks  PLANNED INTERVENTIONS: Aspiration precaution training, Pharyngeal strengthening exercises, Diet toleration management , Environmental controls, Trials of upgraded texture/liquids, Internal/external aids, Oral motor exercises, SLP instruction and feedback, and Compensatory strategies    Felicha Frayne, CCC-SLP 01/12/2023, 10:21 PM

## 2023-01-25 ENCOUNTER — Other Ambulatory Visit (HOSPITAL_COMMUNITY): Payer: Self-pay

## 2023-01-25 DIAGNOSIS — R059 Cough, unspecified: Secondary | ICD-10-CM

## 2023-01-25 DIAGNOSIS — R1310 Dysphagia, unspecified: Secondary | ICD-10-CM

## 2023-02-02 ENCOUNTER — Ambulatory Visit (HOSPITAL_COMMUNITY)
Admission: RE | Admit: 2023-02-02 | Discharge: 2023-02-02 | Disposition: A | Discharge status: Home / Self Care | Payer: No Typology Code available for payment source | Source: Ambulatory Visit | Attending: Diagnostic Radiology | Admitting: Diagnostic Radiology

## 2023-02-02 ENCOUNTER — Ambulatory Visit (HOSPITAL_COMMUNITY)
Admission: RE | Admit: 2023-02-02 | Discharge: 2023-02-02 | Disposition: A | Discharge status: Home / Self Care | Payer: No Typology Code available for payment source | Source: Ambulatory Visit

## 2023-02-02 DIAGNOSIS — R1310 Dysphagia, unspecified: Secondary | ICD-10-CM | POA: Insufficient documentation

## 2023-02-02 DIAGNOSIS — K219 Gastro-esophageal reflux disease without esophagitis: Secondary | ICD-10-CM | POA: Diagnosis not present

## 2023-02-02 DIAGNOSIS — R059 Cough, unspecified: Secondary | ICD-10-CM

## 2023-02-14 ENCOUNTER — Ambulatory Visit: Payer: No Typology Code available for payment source

## 2023-04-27 ENCOUNTER — Institutional Professional Consult (permissible substitution): Payer: No Typology Code available for payment source | Admitting: Plastic Surgery

## 2023-06-22 ENCOUNTER — Ambulatory Visit: Payer: No Typology Code available for payment source | Admitting: Orthopaedic Surgery

## 2023-06-22 VITALS — BP 119/72 | HR 66 | Wt 158.0 lb

## 2023-06-22 DIAGNOSIS — I739 Peripheral vascular disease, unspecified: Secondary | ICD-10-CM

## 2023-06-25 ENCOUNTER — Telehealth: Payer: Self-pay | Admitting: Orthopaedic Surgery

## 2023-06-25 NOTE — Telephone Encounter (Signed)
Patient called. Requesting information on the vascular clinic that Dr. Ophelia Charter is referring him to. His cb# [phone]

## 2023-06-25 NOTE — Telephone Encounter (Signed)
Para March is calling today to get pt scheduled

## 2023-06-26 NOTE — Progress Notes (Signed)
Office Visit Note   Patient: [name]           Date of Birth: May 17, 1946           MRN: [id number] Visit Date: 06/22/2023              Requested by: [facility] [address] [facility] [place],  [place] [address] PCP: [facility]   Assessment & Plan: Visit Diagnoses:  1. Claudication [facility])     Plan: Patient claudication primarily going up hills.  Will obtain some arterial Dopplers she can follow-up after this if this is negative we can consider reimaging his lumbar spine for comparison to 06/03/2021 images.  Follow-Up Instructions: No follow-ups on file.   Orders:  Orders Placed This Encounter  Procedures   VAS [place] ABI WITH/WO TBI   No orders of the defined types were placed in this encounter.     Procedures: No procedures performed   Clinical Data: No additional findings.   Subjective: Chief Complaint  Patient presents with   Lower Back - Pain    HPI 77 year old male with 2-year history of claudication symptoms that occur when he walks up a hill he has to stop and rest no problems walking on the flat and he can walk down heel are flat as far as he wants.  He took some gabapentin which made him dizzy.  He does have CT and MRI.  He has significant calcification of the abdominal aorta and iliac vessels consistent with PAD.  There is some changes in the bilateral sacrum more prominent on the right hemisacrum and on this MRI in 2022 possible diabetes updated sacral MRI was suggested.  Patient had some multilevel degenerative changes most pronounced at L3-4 with mild central stenosis.  Patient does have a history of angina history of cardiac bypass surgery.  He does have hypercholesterol anemia and hypertension.  Review of Systems all other systems noncontributory to HPI.   Objective: Vital Signs: BP 119/72   Pulse 66   Wt 158 lb (71.7 kg)   BMI 21.43 kg/m   Physical Exam Constitutional:      Appearance: He is well-developed.   HENT:     Head: Normocephalic and atraumatic.     Right Ear: External ear normal.     Left Ear: External ear normal.  Eyes:     Pupils: Pupils are equal, round, and reactive to light.  Neck:     Thyroid: No thyromegaly.     Trachea: No tracheal deviation.  Cardiovascular:     Rate and Rhythm: Normal rate.  Pulmonary:     Effort: Pulmonary effort is normal.     Breath sounds: No wheezing.  Abdominal:     General: Bowel sounds are normal.     Palpations: Abdomen is soft.  Musculoskeletal:     Cervical back: Neck supple.  Skin:    General: Skin is warm and dry.     Capillary Refill: Capillary refill takes less than 2 seconds.  Neurological:     Mental Status: He is alert and oriented to person, place, and time.  Psychiatric:        Behavior: Behavior normal.        Thought Content: Thought content normal.        Judgment: Judgment normal.     Ortho Exam patient with decreased pedal pulses mild sciatic notch tenderness right and left no trochanteric bursal tenderness negative logroll hips.  Slow capillary refill to the feet.  Specialty Comments:  No specialty comments available.  Imaging: No results found.   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  Past Surgical History:  Procedure Laterality Date   BROW LIFT Bilateral 05/03/2022   Procedure: LEFT EYELID BLEPHAROPLASTY WITH PTOSIS REPAIR AND RIGHT EYELID BLEPHAROPLASTY WITH POSSIBLE PTOSIS REPAIR;  Surgeon: Janne Napoleon, MD;  Location:  [facility] OR;  Service: Plastics;  Laterality: Bilateral;  1.5 hours   CORONARY ARTERY BYPASS GRAFT  01/18/2018   CABG 01/18/18: SVG-PDA, free RIMA-OM1-LAD with inflow from the LIMA as a Y graft, replacement of ascending aorta and hemi arch using 24 mm Gelweave vascular graft   CORONARY PRESSURE/FFR WITH 3D MAPPING N/A 05/15/2022   Procedure: Coronary Pressure Wire/FFR w/3D Mapping;  Surgeon: Swaziland, Peter M, MD;  Location: [facility] INVASIVE CV LAB;  Service: Cardiovascular;  Laterality: N/A;   CORONARY STENT INTERVENTION N/A 10/24/2021   Procedure: CORONARY STENT INTERVENTION;  Surgeon: Swaziland, Peter M, MD;  Location: [facility] INVASIVE CV LAB;  Service: Cardiovascular;  Laterality: N/A;   HIP ARTHROPLASTY     BILAT   LEFT HEART CATH AND CORONARY ANGIOGRAPHY N/A 05/15/2022   Procedure: LEFT HEART CATH AND CORONARY ANGIOGRAPHY;  Surgeon: Swaziland, Peter M, MD;  Location: [facility] INVASIVE CV LAB;  Service: Cardiovascular;  Laterality: N/A;   NECK MASS EXCISION     RT SIDE LYMPH NODE REMOVED   SHOULDER ARTHROSCOPY WITH SUBACROMIAL DECOMPRESSION AND OPEN ROTATOR C Left 08/13/2015   Procedure: LEFT SHOULDER ARTHROSCOPY WITH SUBACROMIAL DECOMPRESSION AND MINI OPEN ROTATOR CUFF REPAIR, BICEPS TENODESIS AND OPEN DISTAL CLAVICLE RESECTION;  Surgeon: Beverely Low, MD;  Location: [facility] OR;  Service: Orthopedics;  Laterality: Left;   TONSILLECTOMY     WRIST SURGERY     LEFT   Social History   Occupational History   Not on file  Tobacco Use   Smoking status: Never   Smokeless tobacco: Never  Substance and Sexual Activity   Alcohol use: Never   Drug use: Never   Sexual activity: Not on file

## 2023-06-27 ENCOUNTER — Ambulatory Visit (HOSPITAL_COMMUNITY)
Admission: RE | Admit: 2023-06-27 | Discharge: 2023-06-27 | Disposition: A | Discharge status: Home / Self Care | Payer: No Typology Code available for payment source | Source: Ambulatory Visit | Attending: Orthopaedic Surgery | Admitting: Orthopaedic Surgery

## 2023-06-27 DIAGNOSIS — I739 Peripheral vascular disease, unspecified: Secondary | ICD-10-CM | POA: Diagnosis not present

## 2023-06-27 LAB — VAS US ABI WITH/WO TBI
Left ABI: 1.23
Right ABI: 1.32

## 2023-06-28 ENCOUNTER — Other Ambulatory Visit: Payer: Self-pay

## 2023-06-28 DIAGNOSIS — I739 Peripheral vascular disease, unspecified: Secondary | ICD-10-CM

## 2023-07-05 ENCOUNTER — Telehealth: Payer: Self-pay | Admitting: Orthopaedic Surgery

## 2023-07-05 NOTE — Telephone Encounter (Signed)
Patient called and said that he sent the referral to the [facility] but didn't sent the authorization with it. CB#[phone]

## 2023-07-06 NOTE — Telephone Encounter (Signed)
Pt is aware [facility] has the va authorization

## 2023-07-19 ENCOUNTER — Ambulatory Visit
Admission: RE | Admit: 2023-07-19 | Discharge: 2023-07-19 | Disposition: A | Discharge status: Home / Self Care | Payer: No Typology Code available for payment source | Source: Ambulatory Visit | Attending: Orthopaedic Surgery | Admitting: Orthopaedic Surgery

## 2023-07-19 DIAGNOSIS — I739 Peripheral vascular disease, unspecified: Secondary | ICD-10-CM

## 2023-07-25 ENCOUNTER — Ambulatory Visit (INDEPENDENT_AMBULATORY_CARE_PROVIDER_SITE_OTHER): Payer: No Typology Code available for payment source | Admitting: Internal Medicine

## 2023-07-25 ENCOUNTER — Encounter: Payer: Self-pay | Admitting: Internal Medicine

## 2023-07-25 VITALS — BP 120/60 | HR 64 | Ht 72.0 in | Wt 156.2 lb

## 2023-07-25 DIAGNOSIS — K649 Unspecified hemorrhoids: Secondary | ICD-10-CM

## 2023-07-25 DIAGNOSIS — Z860101 Personal history of adenomatous and serrated colon polyps: Secondary | ICD-10-CM

## 2023-07-25 DIAGNOSIS — K625 Hemorrhage of anus and rectum: Secondary | ICD-10-CM

## 2023-07-25 DIAGNOSIS — Z85048 Personal history of other malignant neoplasm of rectum, rectosigmoid junction, and anus: Secondary | ICD-10-CM

## 2023-07-25 NOTE — Patient Instructions (Addendum)
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   YOUR PLAN:  -RECTAL BLEEDING: Rectal bleeding can be caused by various conditions, including hemorrhoids and other issues in the colon or rectum. Given your history of anal cancer, it is important to evaluate the source of the bleeding. We will schedule a colonoscopy to investigate this further.  -HEMORRHOIDS: Hemorrhoids are swollen veins in the lower rectum and anus that can cause pain and bleeding. Your history of radiation treatment makes surgical options more complex, so we will consider non-surgical treatments after your colonoscopy.    INSTRUCTIONS: Colonoscopy as scheduled Consider trying Pristine Toilet Paper Spray ([employer]) - for anal cleansing after bowel movements  I appreciate the opportunity to care for you. Stan Head, MD, [facility]

## 2023-07-25 NOTE — Progress Notes (Signed)
[name] 77 y.o. 03-22-1946 [id number]  Assessment & Plan:   Encounter Diagnoses  Name Primary?   Rectal bleeding Yes   History of anal cancer    Hx of adenomatous colonic polyps        History of anal cancer treated with chemo and radiation in 2006-7. Recent onset of rectal bleeding. No recent colonoscopy. -Schedule colonoscopy to evaluate source of bleeding.  Hemorrhoids reported - history of radiation treatment complicates potential intervention.      I have suggested the use of pristine toilet paper spray to help with anal cleansing.  CC: [facility]    Subjective:   Chief Complaint: rectal bleeding  HPI Discussed the use of AI scribe software for clinical note transcription with the patient, who gave verbal consent to proceed.     Mr. [name], a patient with a history of anal cancer diagnosed in 2006 and treated with chemotherapy and radiation, presents with recent rectal bleeding. He reports having hemorrhoids for the past couple of weeks, but the bleeding predates this. Had rectal bleeding and then swollen irritated and painful hemorrhoids. He also mentions having skin tags in the anal area which make wiping after defecation problematic.. The patient has not had a colonoscopy in several years (10+?)and has not been followed up closely since his cancer diagnosis. he does not have chronic bowel habit issues - did recently have some constipation but that was unusual.  In addition to his cancer history, he has a history of heart disease and has undergone coronary bypass surgery. He reports no current chest pain or breathing problems and is doing well post-surgery.     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  Surgical biopsy.  April 12, 2005 benign colorectal tissue biopsy.  July 2007 colonoscopy polyp  pathology, 2 cecal adenomas, tubular adenoma of the ascending colon with focal glandular high-grade dysplasia.  I am unable to see operative notes or colonoscopy reports in the system.  Allergies  Allergen Reactions   Hydrochlorothiazide Other (See Comments)    Gout   Current Meds  Medication Sig   albuterol (VENTOLIN HFA) 108 (90 Base) MCG/ACT inhaler Inhale 2 puffs into the lungs every 6 (six) hours as needed for wheezing or shortness of breath.   aspirin 81 MG EC tablet Take 81 mg by mouth daily.   atorvastatin (LIPITOR) 20 MG tablet Take 20 mg by mouth every evening.   carvedilol (COREG) 3.125 MG tablet Take 1 tablet (3.125 mg total) by mouth 2 (two) times daily.   lisinopril (PRINIVIL,ZESTRIL) 10 MG tablet Take 10 mg by mouth daily.   nitroGLYCERIN (NITROSTAT) 0.4 MG SL tablet Place 0.4 mg under the tongue every 5 (five) minutes as needed for chest pain.   ondansetron (ZOFRAN-ODT) 4 MG disintegrating tablet Take 1 tablet (4 mg total) by mouth every 8 (eight) hours as needed for nausea or vomiting.   pyridOXINE (VITAMIN B-6) 100 MG tablet Take 100 mg by mouth daily.   tamsulosin (FLOMAX) 0.4 MG CAPS capsule Take 0.4 mg by mouth daily after supper.   Tiotropium Bromide Monohydrate (SPIRIVA RESPIMAT) 2.5 MCG/ACT AERS Inhale 2 puffs into the lungs daily.   Past Medical History:  Diagnosis Date   Adenomatous colon polyps 2007   Anal cancer [facility]) 2006   RADIATION/ CHEMO   Arthritis    Coronary artery  disease    History of kidney stones    one time   Hypertension    Squamous cell skin cancer, multiple sites    Both arms   Past Surgical History:  Procedure Laterality Date   BROW LIFT Bilateral 05/03/2022   Procedure: LEFT EYELID BLEPHAROPLASTY WITH PTOSIS REPAIR AND RIGHT EYELID BLEPHAROPLASTY WITH POSSIBLE PTOSIS REPAIR;  Surgeon: Janne Napoleon, MD;  Location: [facility] OR;  Service: Plastics;  Laterality: Bilateral;  1.5 hours   COLONOSCOPY  2007   CORONARY ARTERY BYPASS GRAFT   01/18/2018   CABG 01/18/18: SVG-PDA, free RIMA-OM1-LAD with inflow from the LIMA as a Y graft, replacement of ascending aorta and hemi arch using 24 mm Gelweave vascular graft   CORONARY PRESSURE/FFR WITH 3D MAPPING N/A 05/15/2022   Procedure: Coronary Pressure Wire/FFR w/3D Mapping;  Surgeon: Swaziland, Peter M, MD;  Location: [facility] INVASIVE CV LAB;  Service: Cardiovascular;  Laterality: N/A;   CORONARY STENT INTERVENTION N/A 10/24/2021   Procedure: CORONARY STENT INTERVENTION;  Surgeon: Swaziland, Peter M, MD;  Location: [facility] INVASIVE CV LAB;  Service: Cardiovascular;  Laterality: N/A;   HIP ARTHROPLASTY     BILAT   LEFT HEART CATH AND CORONARY ANGIOGRAPHY N/A 05/15/2022   Procedure: LEFT HEART CATH AND CORONARY ANGIOGRAPHY;  Surgeon: Swaziland, Peter M, MD;  Location: [facility] INVASIVE CV LAB;  Service: Cardiovascular;  Laterality: N/A;   NECK MASS EXCISION     RT SIDE LYMPH NODE REMOVED   SHOULDER ARTHROSCOPY WITH SUBACROMIAL DECOMPRESSION AND OPEN ROTATOR C Left 08/13/2015   Procedure: LEFT SHOULDER ARTHROSCOPY WITH SUBACROMIAL DECOMPRESSION AND MINI OPEN ROTATOR CUFF REPAIR, BICEPS TENODESIS AND OPEN DISTAL CLAVICLE RESECTION;  Surgeon: Beverely Low, MD;  Location: [facility] OR;  Service: Orthopedics;  Laterality: Left;   TONSILLECTOMY     WRIST SURGERY     LEFT   Social History   Social History Narrative   Married and retired.  No children.  [place] [employer] veteran.\   No alcohol   2 caffeinated beverages daily   Never smoker no drug use         Left handed   One story home    Family history noncontributory   Review of Systems As per HPI.  Otherwise negative.  Objective:   Physical Exam BP 120/60   Pulse 64   Ht 6' (1.829 m)   Wt 156 lb 3.2 oz (70.9 kg)   BMI 21.18 kg/m  Physical Exam   CHEST: Lungs clear to auscultation. CARDIOVASCULAR: Heart sounds normal with regular rate and rhythm. No rubs, murmurs, or gallops. ABDOMEN: Abdomen soft, non-tender without masses. EXTREMITIES: No edema at the  ankles. SKIN: Skin tags and scars from previous biopsies or excisions noted on rectal or anal inspection.

## 2023-07-26 ENCOUNTER — Ambulatory Visit: Payer: No Typology Code available for payment source | Admitting: Nurse Practitioner

## 2023-07-27 ENCOUNTER — Ambulatory Visit (INDEPENDENT_AMBULATORY_CARE_PROVIDER_SITE_OTHER): Payer: No Typology Code available for payment source | Admitting: Orthopaedic Surgery

## 2023-07-27 ENCOUNTER — Encounter: Payer: Self-pay | Admitting: Orthopaedic Surgery

## 2023-07-27 VITALS — BP 145/61 | HR 44 | Ht 72.0 in | Wt 156.0 lb

## 2023-07-27 DIAGNOSIS — G8929 Other chronic pain: Secondary | ICD-10-CM | POA: Diagnosis not present

## 2023-07-27 DIAGNOSIS — M545 Low back pain, unspecified: Secondary | ICD-10-CM | POA: Diagnosis not present

## 2023-07-27 NOTE — Progress Notes (Signed)
Office Visit Note   Patient: [name]           Date of Birth: [birth year]           MRN: [id number] Visit Date: 07/27/2023              Requested by: [facility] [address] [facility] [place],  [place] [address] PCP: [facility]   Assessment & Plan: Visit Diagnoses:  1. Chronic midline low back pain without sciatica    Claudication symptoms without arterial stenosis or spinal stenosis    Plan: I reviewed patient's MRI scan we discussed incidental renal cysts that are present which she had questions about.  He does not have any significant lumbar spinal stenosis on the scan.  He relates when he had injections he only got better for a couple weeks in his back and then had recurrence of symptoms as before.  Patient was very disappointed that study did not find something that could be corrected to take care of his problem so that he could walk up a hill without having pain in his back and legs.  Reviewed in detail that we did not find anything that could be surgically corrected.  Follow-Up Instructions: Return if symptoms worsen or fail to improve.   Orders:  No orders of the defined types were placed in this encounter.  No orders of the defined types were placed in this encounter.     Procedures: No procedures performed   Clinical Data: No additional findings.   Subjective: Chief Complaint  Patient presents with   Lower Back - Pain, Follow-up    MRI lumbar review    HPI 77 year old male with 2-year history of claudication.  He has trouble walking up a hill when he tries to go to the mailbox.  He does fine on the flat.  He had arterial Dopplers which showed satisfactory slow flow to the lower extremities.  He is had cardiac bypass procedure had 100% stenosed RCA midportion and grafts looked good on last study.  He has had new MRI scan which is available for review today in comparison to previous imaging studies January 2023 which  shows unchanged only mild narrowing at L3-4.  Review of Systems updated unchanged.   Objective: Vital Signs: BP (!) 145/61   Pulse (!) 44   Ht 6' (1.829 m)   Wt 156 lb (70.8 kg)   BMI 21.16 kg/m   Physical Exam Constitutional:      Appearance: He is well-developed.  HENT:     Head: Normocephalic and atraumatic.     Right Ear: External ear normal.     Left Ear: External ear normal.  Eyes:     Pupils: Pupils are equal, round, and reactive to light.  Neck:     Thyroid: No thyromegaly.     Trachea: No tracheal deviation.  Cardiovascular:     Rate and Rhythm: Normal rate.  Pulmonary:     Effort: Pulmonary effort is normal.     Breath sounds: No wheezing.  Abdominal:     General: Bowel sounds are normal.     Palpations: Abdomen is soft.  Musculoskeletal:     Cervical back: Neck supple.  Skin:    General: Skin is warm and dry.     Capillary Refill: Capillary refill takes less than 2 seconds.  Neurological:     Mental Status: He is alert and oriented to person, place, and time.  Psychiatric:  Behavior: Behavior normal.        Thought Content: Thought content normal.        Judgment: Judgment normal.     Ortho Exam patient has normal ambulation.  Specialty Comments:  No specialty comments available.  Imaging: Narrative & Impression  CLINICAL DATA:  lumbar MRI for claudication symptoms , lumbar stenosis.   EXAM: MRI LUMBAR SPINE WITHOUT CONTRAST   TECHNIQUE: Multiplanar, multisequence MR imaging of the lumbar spine was performed. No intravenous contrast was administered.   COMPARISON:  Lumbar spine 10/12/21   FINDINGS: Segmentation: In keeping with prior numbering convention, the last well-formed disc space is labeled L5-S1.   Alignment:  Physiologic.   Vertebrae:  No fracture, evidence of discitis, or bone lesion.   Conus medullaris and cauda equina: Conus extends to the T12-L1 disc space level. Conus and cauda equina appear normal.    Paraspinal and other soft tissues: Bilateral T2 hyperintense lesions are compatible with renal cysts   Disc levels:   T12-L1: Mild bilateral facet degenerative change. Minimal disc bulge. No spinal canal narrowing. No neural foraminal narrowing.   L1-L2: Mild bilateral facet degenerative change. No significant disc bulge. Spinal canal narrowing. Mild bilateral neural foraminal narrowing.   L2-L3: Mild bilateral facet degenerative change. Short pedicles. No significant disc bulge. No spinal canal narrowing. Mild bilateral neural foraminal narrowing.   L3-L4: Moderate bilateral facet degenerative change. Ligamentum flavum hypertrophy. Mild spinal canal narrowing. Moderate left and mild right neural foraminal narrowing. Findings are unchanged from prior   L4-L5: Moderate left and mild right facet degenerative change. No significant disc bulge. No spinal canal narrowing. Mild bilateral neural foraminal narrowing, left-greater-than-right   L5-S1: Mild bilateral facet degenerative change. No significant disc bulge. No spinal canal narrowing. No neural foraminal narrowing.   IMPRESSION: 1. No significant interval change in the appearance of the lumbar spine since the prior MRI from 10/12/21. 2. Mild spinal canal and moderate left neural foraminal narrowing at L3-L4.     Electronically Signed   By: Lorenza Cambridge M.D.   On: 07/27/2023 09:10     PMFS History: Patient Active Problem List   Diagnosis Date Noted   Low back pain 07/27/2023   Dyspnea on exertion 10/24/2021   Angina pectoris (HCC) 10/24/2021   CAD of autologous bypass graft 10/24/2021   HTN (hypertension) 10/24/2021   Hypercholesterolemia 10/24/2021   Past Medical History:  Diagnosis Date   Adenomatous colon polyps 2007   Anal cancer (HCC) 2006   RADIATION/ CHEMO   Arthritis    Coronary artery disease    History of kidney stones    one time   Hypertension    Squamous cell skin cancer, multiple sites     Both arms    No family history on file.  Past Surgical History:  Procedure Laterality Date   BROW LIFT Bilateral 05/03/2022   Procedure: LEFT EYELID BLEPHAROPLASTY WITH PTOSIS REPAIR AND RIGHT EYELID BLEPHAROPLASTY WITH POSSIBLE PTOSIS REPAIR;  Surgeon: Janne Napoleon, MD;  Location: [facility] OR;  Service: Plastics;  Laterality: Bilateral;  1.5 hours   COLONOSCOPY  2007   CORONARY ARTERY BYPASS GRAFT  01/18/2018   CABG 01/18/18: SVG-PDA, free RIMA-OM1-LAD with inflow from the LIMA as a Y graft, replacement of ascending aorta and hemi arch using 24 mm Gelweave vascular graft   CORONARY PRESSURE/FFR WITH 3D MAPPING N/A 05/15/2022   Procedure: Coronary Pressure Wire/FFR w/3D Mapping;  Surgeon: Swaziland, Peter M, MD;  Location: [facility] INVASIVE CV LAB;  Service: Cardiovascular;  Laterality: N/A;   CORONARY STENT INTERVENTION N/A 10/24/2021   Procedure: CORONARY STENT INTERVENTION;  Surgeon: Swaziland, Peter M, MD;  Location: [facility] INVASIVE CV LAB;  Service: Cardiovascular;  Laterality: N/A;   HIP ARTHROPLASTY     BILAT   LEFT HEART CATH AND CORONARY ANGIOGRAPHY N/A 05/15/2022   Procedure: LEFT HEART CATH AND CORONARY ANGIOGRAPHY;  Surgeon: Swaziland, Peter M, MD;  Location: [facility] INVASIVE CV LAB;  Service: Cardiovascular;  Laterality: N/A;   NECK MASS EXCISION     RT SIDE LYMPH NODE REMOVED   SHOULDER ARTHROSCOPY WITH SUBACROMIAL DECOMPRESSION AND OPEN ROTATOR C Left 08/13/2015   Procedure: LEFT SHOULDER ARTHROSCOPY WITH SUBACROMIAL DECOMPRESSION AND MINI OPEN ROTATOR CUFF REPAIR, BICEPS TENODESIS AND OPEN DISTAL CLAVICLE RESECTION;  Surgeon: Beverely Low, MD;  Location: [facility] OR;  Service: Orthopedics;  Laterality: Left;   TONSILLECTOMY     WRIST SURGERY     LEFT   Social History   Occupational History   Not on file  Tobacco Use   Smoking status: Never   Smokeless tobacco: Never  Substance and Sexual Activity   Alcohol use: Never   Drug use: Never   Sexual activity: Not on file

## 2023-07-31 ENCOUNTER — Ambulatory Visit (AMBULATORY_SURGERY_CENTER): Payer: No Typology Code available for payment source | Admitting: Internal Medicine

## 2023-07-31 ENCOUNTER — Encounter: Payer: Self-pay | Admitting: Internal Medicine

## 2023-07-31 VITALS — BP 108/48 | HR 44 | Temp 97.3°F | Resp 11 | Ht 72.0 in | Wt 156.0 lb

## 2023-07-31 DIAGNOSIS — Z860101 Personal history of adenomatous and serrated colon polyps: Secondary | ICD-10-CM

## 2023-07-31 DIAGNOSIS — D128 Benign neoplasm of rectum: Secondary | ICD-10-CM

## 2023-07-31 DIAGNOSIS — K625 Hemorrhage of anus and rectum: Secondary | ICD-10-CM | POA: Diagnosis present

## 2023-07-31 DIAGNOSIS — D124 Benign neoplasm of descending colon: Secondary | ICD-10-CM

## 2023-07-31 DIAGNOSIS — D120 Benign neoplasm of cecum: Secondary | ICD-10-CM | POA: Diagnosis not present

## 2023-07-31 DIAGNOSIS — Z85048 Personal history of other malignant neoplasm of rectum, rectosigmoid junction, and anus: Secondary | ICD-10-CM

## 2023-07-31 MED ORDER — SODIUM CHLORIDE 0.9 % IV SOLN: 500.0000 mL | Freq: Once | INTRAVENOUS | Status: DC

## 2023-07-31 NOTE — Progress Notes (Signed)
History and Physical Interval Note:  07/31/2023 8:09 AM  [name]  has presented today for endoscopic procedure(s), with the diagnosis of  Encounter Diagnoses  Name Primary?   Rectal bleeding Yes   Hx of adenomatous colonic polyps    History of anal cancer   .  The various methods of evaluation and treatment have been discussed with the patient and/or family. After consideration of risks, benefits and other options for treatment, the patient has consented to  the endoscopic procedure(s).   The patient's history has been reviewed, patient examined, no change in status, stable for endoscopic procedure(s).  I have reviewed the patient's chart and labs.  Questions were answered to the patient's satisfaction.     Iva Boop, MD, [facility]

## 2023-07-31 NOTE — Op Note (Signed)
[facility] Patient Name: [name] Procedure Date: 07/31/2023 8:08 AM MRN: [id number] Endoscopist: Iva Boop , MD, [id number] Age: 77 Referring MD:  Date of Birth: 1946-08-09 Gender: Male Account #: [id number] Procedure:                Colonoscopy Indications:              Rectal bleeding (hx anal cancer s/p chemoXRT and hx                            adenomas) Medicines:                Monitored Anesthesia Care Procedure:                Pre-Anesthesia Assessment:                           - Prior to the procedure, a History and Physical                            was performed, and patient medications and                            allergies were reviewed. The patient's tolerance of                            previous anesthesia was also reviewed. The risks                            and benefits of the procedure and the sedation                            options and risks were discussed with the patient.                            All questions were answered, and informed consent                            was obtained. Prior Anticoagulants: The patient has                            taken no anticoagulant or antiplatelet agents. ASA                            Grade Assessment: II - A patient with mild systemic                            disease. After reviewing the risks and benefits,                            the patient was deemed in satisfactory condition to                            undergo the procedure.  After obtaining informed consent, the colonoscope                            was passed under direct vision. Throughout the                            procedure, the patient's blood pressure, pulse, and                            oxygen saturations were monitored continuously. The                            CF [id number] #[id number] was introduced through the anus                            and advanced to the the cecum, identified by                             appendiceal orifice and ileocecal valve. The                            colonoscopy was performed without difficulty. The                            patient tolerated the procedure well. The quality                            of the bowel preparation was excellent. The                            ileocecal valve, appendiceal orifice, and rectum                            were photographed. Scope In: 8:14:09 AM Scope Out: 8:35:09 AM Scope Withdrawal Time: 0 hours 14 minutes 44 seconds  Total Procedure Duration: 0 hours 21 minutes 0 seconds  Findings:                 The perianal exam findings include non-thrombosed                            external hemorrhoids/tags.                           The digital rectal exam findings include anal                            stricture.                           A 5 mm polyp was found in the cecum. The polyp was                            sessile. The polyp was removed with a cold snare.  Resection and retrieval were complete. Verification                            of patient identification for the specimen was                            done. Estimated blood loss was minimal.                           Three sessile polyps were found in the descending                            colon. The polyps were 5 to 12 mm in size. These                            polyps were removed with a cold snare. Resection                            and retrieval were complete. Verification of                            patient identification for the specimen was done.                            Estimated blood loss was minimal.                           A 5 mm polyp was found in the rectum. The polyp was                            sessile. The polyp was removed with a cold snare.                            Resection and retrieval were complete. Verification                            of patient identification for the  specimen was                            done. Estimated blood loss was minimal.                           A benign-appearing, intrinsic mild stenosis was                            found at the anus and in the rectum. Complications:            No immediate complications. Estimated Blood Loss:     Estimated blood loss was minimal. Impression:               - Non-thrombosed external hemorrhoids found on                            perianal exam.                           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 Resected and retrieved.                           - Three 5 to 12 mm polyps in the descending colon,                            removed with a cold snare. Resected and retrieved.                           - One 5 mm polyp in the rectum, removed with a cold                            snare. Resected and retrieved.                           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 - as                            above this prevented retroflexion                           - Personal history of colonic polyps. hx adenomas x                            3 2007 Recommendation:           - Patient has a contact number available for                            emergencies. The signs and symptoms of potential                            delayed complications were discussed with the                            patient. Return to normal activities tomorrow.                            Written discharge instructions were provided to the                            patient.                           - Resume previous diet.                           -  Continue present medications.                           - Await pathology results.                           - No recommendation at this time regarding repeat                             colonoscopy. Iva Boop, MD 07/31/2023 8:49:30 AM This report has been signed electronically.

## 2023-07-31 NOTE — Progress Notes (Signed)
Sedate, gd SR, tolerated procedure well, VSS, report to RN 

## 2023-07-31 NOTE — Progress Notes (Signed)
Pt's states no medical or surgical changes since previsit or office visit. 

## 2023-07-31 NOTE — Patient Instructions (Addendum)
I found and removed 5 polyps that look benign. 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 These cause the bleeding.  I will let you know pathology results by mail and/or My Chart. Not sure you need to plan on a routine colonoscopy going forward.  I appreciate the opportunity to care for you. Iva Boop, MD, FACG   YOU HAD AN ENDOSCOPIC PROCEDURE TODAY AT THE [facility]:   Refer to the procedure report that was given to you for any specific questions about what was found during the examination.  If the procedure report does not answer your questions, please call your gastroenterologist to clarify.  If you requested that your care partner not be given the details of your procedure findings, then the procedure report has been included in a sealed envelope for you to review at your convenience later.  YOU SHOULD EXPECT: Some feelings of bloating in the abdomen. Passage of more gas than usual.  Walking can help get rid of the air that was put into your GI tract during the procedure and reduce the bloating. If you had a lower endoscopy (such as a colonoscopy or flexible sigmoidoscopy) you may notice spotting of blood in your stool or on the toilet paper. If you underwent a bowel prep for your procedure, you may not have a normal bowel movement for a few days.  Please Note:  You might notice some irritation and congestion in your nose or some drainage.  This is from the oxygen used during your procedure.  There is no need for concern and it should clear up in a day or so.  SYMPTOMS TO REPORT IMMEDIATELY:  Following lower endoscopy (colonoscopy or flexible sigmoidoscopy):  Excessive amounts of blood in the stool  Significant tenderness or worsening of abdominal pains  Swelling of the abdomen that is new, acute  Fever of 100F or higher  For urgent or emergent issues, a gastroenterologist can be reached at any hour by calling [phone]. Do not use MyChart messaging  for urgent concerns.    DIET:  We do recommend a small meal at first, but then you may proceed to your regular diet.  Drink plenty of fluids but you should avoid alcoholic beverages for 24 hours.  ACTIVITY:  You should plan to take it easy for the rest of today and you should NOT DRIVE or use heavy machinery until tomorrow (because of the sedation medicines used during the test).    FOLLOW UP: Our staff will call the number listed on your records the next business day following your procedure.  We will call around 7:15- 8:00 am to check on you and address any questions or concerns that you may have regarding the information given to you following your procedure. If we do not reach you, we will leave a message.     If any biopsies were taken you will be contacted by phone or by letter within the next 1-3 weeks.  Please call us at [phone] if you have not heard about the biopsies in 3 weeks.    SIGNATURES/CONFIDENTIALITY: You and/or your care partner have signed paperwork which will be entered into your electronic medical record.  These signatures attest to the fact that that the information above on your After Visit Summary has been reviewed and is understood.  Full responsibility of the confidentiality of this discharge information lies with you and/or your care-partner.

## 2023-07-31 NOTE — Progress Notes (Signed)
Called to room to assist during endoscopic procedure.  Patient ID and intended procedure confirmed with present staff. Received instructions for my participation in the procedure from the performing physician.  

## 2023-08-01 ENCOUNTER — Telehealth: Payer: Self-pay | Admitting: *Deleted

## 2023-08-01 NOTE — Telephone Encounter (Signed)
  Follow up Call-     07/31/2023    7:47 AM  Call back number  Post procedure Call Back phone  # [phone]  Permission to leave phone message Yes     Patient questions:  Do you have a fever, pain , or abdominal swelling? No. Pain Score  0 *  Have you tolerated food without any problems? Yes.    Have you been able to return to your normal activities? Yes.    Do you have any questions about your discharge instructions: Diet   No. Medications  No. Follow up visit  No.  Do you have questions or concerns about your Care? No.  Actions: * If pain score is 4 or above: No action needed, pain <4.

## 2023-08-02 LAB — SURGICAL PATHOLOGY

## 2023-08-03 ENCOUNTER — Encounter: Payer: Self-pay | Admitting: Internal Medicine

## 2023-09-03 ENCOUNTER — Telehealth: Payer: Self-pay | Admitting: Internal Medicine

## 2023-09-03 NOTE — Telephone Encounter (Signed)
Patient called stated he just had a procedure with Dr. Leone Payor and he was suppose to get another procedure done for hemorrhoids. Scheduled patient for a follow up appt but not sure if he was advised otherwise. Please advise.

## 2023-09-04 NOTE — Telephone Encounter (Signed)
Left message for pt to call back  °

## 2023-09-04 NOTE — Telephone Encounter (Signed)
Pt stated that he was notified after his recent colonoscopy that  is supposed to have a surgery for hemorrhoids. Pt did not know if this was for an actual surgery or if it was for a hemorrhoid banding. Pt has previously been  scheduled for an office visit for  09/07/2023 at 1:30 to see Dr. Leone Payor. Pt made aware.  Please advise if you would like for me to change this appointment to a hem band appointment.

## 2023-09-04 NOTE — Telephone Encounter (Signed)
He has external hemorrhoids and I cannot help with those. Only band internal hemorrhoids.  Furthermore he has a complicated history with prior anal cancer and radiation so I am not sure what could be done in that setting.  He needs to see a surgeon to see what might be possible. He is a [place] patient and [facility]  I don't think he needs the 12/6 appointment but if he wants to keep it after gearing this information - ok.  I am sorry if there was a misunderstanding - I believe I communicated it this way after colonoscopy but sedation effects could have interfered with memory.

## 2023-09-05 NOTE — Telephone Encounter (Signed)
Pt made aware of Dr. Leone Payor recommendations: Appointment was canceled. Pt made aware. Pt verbalized understanding with all questions answered.

## 2023-09-07 ENCOUNTER — Ambulatory Visit: Payer: No Typology Code available for payment source | Admitting: Internal Medicine

## 2023-09-10 NOTE — Telephone Encounter (Signed)
[facility] called to ask if the conversation pertaining to the recommendations for external hemorrhoid surgery between Dr. Leone Payor and his nurse can be forwarded to them. Please advise.

## 2023-09-11 NOTE — Telephone Encounter (Signed)
Pt was made aware that the referral would have to come from the [place]. Pt was given the number to [facility] if he would like the referral to be made there.  Pt verbalized understanding with all questions answered.

## 2023-09-11 NOTE — Telephone Encounter (Signed)
Left message for pt to call back  °

## 2024-02-20 ENCOUNTER — Other Ambulatory Visit: Payer: Self-pay

## 2024-02-20 ENCOUNTER — Encounter (HOSPITAL_COMMUNITY)
Admission: EM | Disposition: A | Discharge status: Home / Self Care | Payer: Self-pay | Source: Home / Self Care | Attending: Internal Medicine

## 2024-02-20 ENCOUNTER — Observation Stay (HOSPITAL_COMMUNITY)
Admission: EM | Admit: 2024-02-20 | Discharge: 2024-02-21 | Disposition: A | Discharge status: Home / Self Care | Attending: Family Medicine | Admitting: Family Medicine

## 2024-02-20 ENCOUNTER — Encounter (HOSPITAL_COMMUNITY): Payer: Self-pay

## 2024-02-20 ENCOUNTER — Emergency Department (HOSPITAL_COMMUNITY)

## 2024-02-20 ENCOUNTER — Observation Stay (HOSPITAL_COMMUNITY): Admitting: Anesthesiology

## 2024-02-20 ENCOUNTER — Observation Stay (HOSPITAL_BASED_OUTPATIENT_CLINIC_OR_DEPARTMENT_OTHER): Admitting: Anesthesiology

## 2024-02-20 DIAGNOSIS — I10 Essential (primary) hypertension: Secondary | ICD-10-CM | POA: Diagnosis not present

## 2024-02-20 DIAGNOSIS — Z85828 Personal history of other malignant neoplasm of skin: Secondary | ICD-10-CM | POA: Diagnosis not present

## 2024-02-20 DIAGNOSIS — D649 Anemia, unspecified: Secondary | ICD-10-CM | POA: Insufficient documentation

## 2024-02-20 DIAGNOSIS — M65321 Trigger finger, right index finger: Secondary | ICD-10-CM | POA: Diagnosis not present

## 2024-02-20 DIAGNOSIS — L02511 Cutaneous abscess of right hand: Secondary | ICD-10-CM | POA: Diagnosis present

## 2024-02-20 DIAGNOSIS — D696 Thrombocytopenia, unspecified: Secondary | ICD-10-CM | POA: Diagnosis not present

## 2024-02-20 DIAGNOSIS — I2510 Atherosclerotic heart disease of native coronary artery without angina pectoris: Secondary | ICD-10-CM

## 2024-02-20 DIAGNOSIS — Z85048 Personal history of other malignant neoplasm of rectum, rectosigmoid junction, and anus: Secondary | ICD-10-CM | POA: Diagnosis not present

## 2024-02-20 DIAGNOSIS — L03019 Cellulitis of unspecified finger: Secondary | ICD-10-CM

## 2024-02-20 DIAGNOSIS — Z7982 Long term (current) use of aspirin: Secondary | ICD-10-CM | POA: Insufficient documentation

## 2024-02-20 DIAGNOSIS — M65841 Other synovitis and tenosynovitis, right hand: Secondary | ICD-10-CM | POA: Insufficient documentation

## 2024-02-20 DIAGNOSIS — Z951 Presence of aortocoronary bypass graft: Secondary | ICD-10-CM | POA: Diagnosis not present

## 2024-02-20 DIAGNOSIS — M65941 Unspecified synovitis and tenosynovitis, right hand: Secondary | ICD-10-CM | POA: Diagnosis not present

## 2024-02-20 DIAGNOSIS — M10041 Idiopathic gout, right hand: Secondary | ICD-10-CM | POA: Diagnosis present

## 2024-02-20 DIAGNOSIS — E785 Hyperlipidemia, unspecified: Secondary | ICD-10-CM | POA: Diagnosis not present

## 2024-02-20 DIAGNOSIS — Z79899 Other long term (current) drug therapy: Secondary | ICD-10-CM | POA: Insufficient documentation

## 2024-02-20 DIAGNOSIS — M65949 Unspecified synovitis and tenosynovitis, unspecified hand: Principal | ICD-10-CM

## 2024-02-20 HISTORY — PX: INCISION AND DRAINAGE OF WOUND: SHX1803

## 2024-02-20 LAB — BASIC METABOLIC PANEL WITH GFR
Anion gap: 7 (ref 5–15)
BUN: 30 mg/dL — ABNORMAL HIGH (ref 8–23)
CO2: 22 mmol/L (ref 22–32)
Calcium: 9.5 mg/dL (ref 8.9–10.3)
Chloride: 112 mmol/L — ABNORMAL HIGH (ref 98–111)
Creatinine, Ser: 1.17 mg/dL (ref 0.61–1.24)
GFR, Estimated: >60 mL/min (ref >60)
Glucose, Bld: 98 mg/dL (ref 70–99)
Potassium: 5.1 mmol/L (ref 3.5–5.1)
Sodium: 141 mmol/L (ref 135–145)

## 2024-02-20 LAB — CBC WITH DIFFERENTIAL/PLATELET
Abs Immature Granulocytes: 0.02 10*3/uL (ref 0.00–0.07)
Basophils Absolute: 0 10*3/uL (ref 0.0–0.1)
Basophils Relative: 0 %
Eosinophils Absolute: 0.1 10*3/uL (ref 0.0–0.5)
Eosinophils Relative: 3 %
HCT: 39.8 % (ref 39.0–52.0)
Hemoglobin: 12.4 g/dL — ABNORMAL LOW (ref 13.0–17.0)
Immature Granulocytes: 1 %
Lymphocytes Relative: 23 %
Lymphs Abs: 0.9 10*3/uL (ref 0.7–4.0)
MCH: 30.2 pg (ref 26.0–34.0)
MCHC: 31.2 g/dL (ref 30.0–36.0)
MCV: 97.1 fL (ref 80.0–100.0)
Monocytes Absolute: 0.3 10*3/uL (ref 0.1–1.0)
Monocytes Relative: 8 %
Neutro Abs: 2.6 10*3/uL (ref 1.7–7.7)
Neutrophils Relative %: 65 %
Platelets: 134 10*3/uL — ABNORMAL LOW (ref 150–400)
RBC: 4.1 MIL/uL — ABNORMAL LOW (ref 4.22–5.81)
RDW: 14.6 % (ref 11.5–15.5)
WBC: 4 10*3/uL (ref 4.0–10.5)
nRBC: 0 % (ref 0.0–0.2)

## 2024-02-20 LAB — C-REACTIVE PROTEIN: CRP: 1.9 mg/dL — ABNORMAL HIGH (ref <1.0)

## 2024-02-20 LAB — SEDIMENTATION RATE: Sed Rate: 24 mm/h — ABNORMAL HIGH (ref 0–16)

## 2024-02-20 SURGERY — IRRIGATION AND DEBRIDEMENT WOUND
Anesthesia: General | Laterality: Right

## 2024-02-20 MED ORDER — FENTANYL CITRATE (PF) 100 MCG/2ML IJ SOLN
INTRAMUSCULAR | Status: AC
  Filled 2024-02-20: qty 2

## 2024-02-20 MED ORDER — CEFAZOLIN SODIUM-DEXTROSE 2-4 GM/100ML-% IV SOLN
2.0000 g | INTRAVENOUS | Status: AC
  Administered 2024-02-20: 2 g via INTRAVENOUS
  Filled 2024-02-20: qty 100

## 2024-02-20 MED ORDER — POVIDONE-IODINE 10 % EX SWAB: 2.0000 | Freq: Once | CUTANEOUS | Status: DC

## 2024-02-20 MED ORDER — MORPHINE SULFATE (PF) 4 MG/ML IV SOLN
4.0000 mg | Freq: Once | INTRAVENOUS | Status: AC
  Administered 2024-02-20: 4 mg via INTRAVENOUS
  Filled 2024-02-20: qty 1

## 2024-02-20 MED ORDER — FENTANYL CITRATE PF 50 MCG/ML IJ SOSY
25.0000 ug | PREFILLED_SYRINGE | INTRAMUSCULAR | Status: DC | PRN
  Administered 2024-02-20: 50 ug via INTRAVENOUS

## 2024-02-20 MED ORDER — SODIUM CHLORIDE 0.9 % IV SOLN
3.0000 g | Freq: Once | INTRAVENOUS | Status: AC
  Administered 2024-02-20: 3 g via INTRAVENOUS
  Filled 2024-02-20: qty 8

## 2024-02-20 MED ORDER — FENTANYL CITRATE (PF) 100 MCG/2ML IJ SOLN
INTRAMUSCULAR | Status: DC | PRN
  Administered 2024-02-20 (×4): 25 ug via INTRAVENOUS

## 2024-02-20 MED ORDER — EPHEDRINE SULFATE-NACL 50-0.9 MG/10ML-% IV SOSY
PREFILLED_SYRINGE | INTRAVENOUS | Status: DC | PRN
Start: 2024-02-20 — End: 2024-02-20
  Administered 2024-02-20: 10 mg via INTRAVENOUS

## 2024-02-20 MED ORDER — OXYCODONE HCL 5 MG PO TABS: 5.0000 mg | ORAL_TABLET | ORAL | Status: DC | PRN

## 2024-02-20 MED ORDER — VANCOMYCIN HCL 1.25 G IV SOLR
1250.0000 mg | INTRAVENOUS | Status: DC
  Administered 2024-02-21: 1250 mg via INTRAVENOUS
  Filled 2024-02-20: qty 25

## 2024-02-20 MED ORDER — ONDANSETRON HCL 4 MG/2ML IJ SOLN
INTRAMUSCULAR | Status: AC
  Filled 2024-02-20: qty 2

## 2024-02-20 MED ORDER — SODIUM CHLORIDE 0.9 % IV SOLN
3.0000 g | Freq: Four times a day (QID) | INTRAVENOUS | Status: DC
  Administered 2024-02-20 – 2024-02-21 (×2): 3 g via INTRAVENOUS
  Filled 2024-02-20 (×3): qty 8

## 2024-02-20 MED ORDER — OXYCODONE HCL 5 MG PO TABS: 5.0000 mg | ORAL_TABLET | Freq: Once | ORAL | Status: DC | PRN

## 2024-02-20 MED ORDER — ISOPROPYL ALCOHOL 70 % SOLN
Status: AC
  Filled 2024-02-20: qty 480

## 2024-02-20 MED ORDER — OXYCODONE HCL 5 MG/5ML PO SOLN: 5.0000 mg | Freq: Once | ORAL | Status: DC | PRN

## 2024-02-20 MED ORDER — ENOXAPARIN SODIUM 40 MG/0.4ML IJ SOSY: 40.0000 mg | PREFILLED_SYRINGE | INTRAMUSCULAR | Status: DC

## 2024-02-20 MED ORDER — BUPIVACAINE HCL (PF) 0.5 % IJ SOLN
INTRAMUSCULAR | Status: AC
Start: 2024-02-20 — End: ?
  Filled 2024-02-20: qty 30

## 2024-02-20 MED ORDER — ACETAMINOPHEN 10 MG/ML IV SOLN: 1000.0000 mg | Freq: Once | INTRAVENOUS | Status: DC | PRN

## 2024-02-20 MED ORDER — CHLORHEXIDINE GLUCONATE 4 % EX SOLN
60.0000 mL | Freq: Once | CUTANEOUS | Status: AC
  Administered 2024-02-20: 4 via TOPICAL

## 2024-02-20 MED ORDER — ONDANSETRON HCL 4 MG/2ML IJ SOLN
INTRAMUSCULAR | Status: DC | PRN
  Administered 2024-02-20: 4 mg via INTRAVENOUS

## 2024-02-20 MED ORDER — ACETAMINOPHEN 325 MG PO TABS: 650.0000 mg | ORAL_TABLET | Freq: Four times a day (QID) | ORAL | Status: DC | PRN

## 2024-02-20 MED ORDER — LIDOCAINE HCL (CARDIAC) PF 100 MG/5ML IV SOSY
PREFILLED_SYRINGE | INTRAVENOUS | Status: DC | PRN
  Administered 2024-02-20: 60 mg via INTRAVENOUS

## 2024-02-20 MED ORDER — LIDOCAINE HCL (PF) 2 % IJ SOLN
INTRAMUSCULAR | Status: AC
  Filled 2024-02-20: qty 5

## 2024-02-20 MED ORDER — PROPOFOL 10 MG/ML IV BOLUS
INTRAVENOUS | Status: AC
  Filled 2024-02-20: qty 20

## 2024-02-20 MED ORDER — VANCOMYCIN HCL IN DEXTROSE 1-5 GM/200ML-% IV SOLN
1000.0000 mg | Freq: Once | INTRAVENOUS | Status: AC
  Administered 2024-02-20: 1000 mg via INTRAVENOUS
  Filled 2024-02-20: qty 200

## 2024-02-20 MED ORDER — DEXAMETHASONE SODIUM PHOSPHATE 10 MG/ML IJ SOLN
INTRAMUSCULAR | Status: DC | PRN
Start: 2024-02-20 — End: 2024-02-20
  Administered 2024-02-20: 8 mg via INTRAVENOUS

## 2024-02-20 MED ORDER — ONDANSETRON HCL 4 MG PO TABS: 4.0000 mg | ORAL_TABLET | Freq: Four times a day (QID) | ORAL | Status: DC | PRN

## 2024-02-20 MED ORDER — ONDANSETRON HCL 4 MG/2ML IJ SOLN: 4.0000 mg | Freq: Four times a day (QID) | INTRAMUSCULAR | Status: DC | PRN

## 2024-02-20 MED ORDER — 0.9 % SODIUM CHLORIDE (POUR BTL) OPTIME
TOPICAL | Status: DC | PRN
  Administered 2024-02-20: 1000 mL

## 2024-02-20 MED ORDER — FENTANYL CITRATE PF 50 MCG/ML IJ SOSY
PREFILLED_SYRINGE | INTRAMUSCULAR | Status: AC
  Filled 2024-02-20: qty 1

## 2024-02-20 MED ORDER — PROPOFOL 10 MG/ML IV BOLUS
INTRAVENOUS | Status: DC | PRN
  Administered 2024-02-20: 110 mg via INTRAVENOUS

## 2024-02-20 MED ORDER — LACTATED RINGERS IV SOLN: INTRAVENOUS | Status: DC | PRN

## 2024-02-20 MED ORDER — ACETAMINOPHEN 650 MG RE SUPP: 650.0000 mg | Freq: Four times a day (QID) | RECTAL | Status: DC | PRN

## 2024-02-20 MED ORDER — DEXAMETHASONE SODIUM PHOSPHATE 10 MG/ML IJ SOLN
INTRAMUSCULAR | Status: AC
  Filled 2024-02-20: qty 1

## 2024-02-20 SURGICAL SUPPLY — 34 items
BAG COUNTER SPONGE SURGICOUNT (BAG) IMPLANT
BAG ZIPLOCK 12X15 (MISCELLANEOUS) ×1 IMPLANT
BLADE SURG SZ10 CARB STEEL (BLADE) ×1 IMPLANT
BNDG ELASTIC 4INX 5YD STR LF (GAUZE/BANDAGES/DRESSINGS) ×1 IMPLANT
BNDG ESMARK 4X9 LF (GAUZE/BANDAGES/DRESSINGS) ×1 IMPLANT
BNDG GAUZE DERMACEA FLUFF 4 (GAUZE/BANDAGES/DRESSINGS) ×1 IMPLANT
COVER MAYO STAND STRL (DRAPES) ×1 IMPLANT
COVER SURGICAL LIGHT HANDLE (MISCELLANEOUS) ×1 IMPLANT
CUFF TOURN SGL QUICK 18X4 (TOURNIQUET CUFF) ×1 IMPLANT
DRAIN PENROSE 0.5X18 (DRAIN) ×1 IMPLANT
DRAPE SURG 17X11 SM STRL (DRAPES) ×2 IMPLANT
DRSG EMULSION OIL 3X3 NADH (GAUZE/BANDAGES/DRESSINGS) ×1 IMPLANT
ELECT REM PT RETURN 15FT ADLT (MISCELLANEOUS) ×1 IMPLANT
GAUZE PAD ABD 8X10 STRL (GAUZE/BANDAGES/DRESSINGS) ×1 IMPLANT
GAUZE SPONGE 4X4 12PLY STRL (GAUZE/BANDAGES/DRESSINGS) ×1 IMPLANT
GLOVE SURG ORTHO 8.0 STRL STRW (GLOVE) ×1 IMPLANT
GOWN STRL REUS W/ TWL XL LVL3 (GOWN DISPOSABLE) ×1 IMPLANT
HIBICLENS CHG 4% 4OZ BTL (MISCELLANEOUS) IMPLANT
IV LR IRRIG 3000ML ARTHROMATIC (IV SOLUTION) ×1 IMPLANT
KIT BASIN OR (CUSTOM PROCEDURE TRAY) ×1 IMPLANT
KIT TURNOVER KIT A (KITS) IMPLANT
MANIFOLD NEPTUNE II (INSTRUMENTS) ×1 IMPLANT
PACK ORTHO EXTREMITY (CUSTOM PROCEDURE TRAY) ×1 IMPLANT
PAD CAST 4YDX4 CTTN HI CHSV (CAST SUPPLIES) ×1 IMPLANT
PENCIL SMOKE EVACUATOR (MISCELLANEOUS) IMPLANT
PROTECTOR NERVE ULNAR (MISCELLANEOUS) ×1 IMPLANT
SOL PREP POV-IOD 4OZ 10% (MISCELLANEOUS) ×1 IMPLANT
SOLUTION SCRB POV-IOD 4OZ 7.5% (MISCELLANEOUS) ×1 IMPLANT
SUT PROLENE 3 0 PS 2 (SUTURE) ×1 IMPLANT
SUT VIC AB 1 CT1 27XBRD ANTBC (SUTURE) ×1 IMPLANT
SUT VIC AB 2-0 CT1 27XBRD (SUTURE) ×1 IMPLANT
SYR 20ML LL LF (SYRINGE) ×1 IMPLANT
SYR CONTROL 10ML LL (SYRINGE) ×1 IMPLANT
TOWEL OR 17X26 10 PK STRL BLUE (TOWEL DISPOSABLE) ×1 IMPLANT

## 2024-02-20 NOTE — H&P (Signed)
 History and Physical  [name] MWU:[id number] DOB: [birth year] DOA: 02/20/2024  PCP: [facility]   Chief Complaint: Right index finger pain  HPI: [name] is a 78 y.o. male with medical history significant for hypertension, gout, CAD being admitted to the hospital with right index finger cellulitis and concern for abscess.  Patient states that he has had no trauma, he had sudden onset of pain and swelling in the tip of the right index finger starting over 2 weeks ago, has become progressively worse.  Denies any fevers, chills, nausea, vomiting, open areas, or drainage.  Came to the ER today for evaluation due to continued pain.  Review of Systems: Please see HPI for pertinent positives and negatives. A complete 10 system review of systems are otherwise negative.  Past Medical History:  Diagnosis Date   Adenomatous colon polyps 2007   Anal cancer (HCC) 2006   RADIATION/ CHEMO   Arthritis    Coronary artery disease    History of kidney stones    one time   Hypertension    Squamous cell skin cancer, multiple sites    Both arms   Past Surgical History:  Procedure Laterality Date   BROW LIFT Bilateral 05/03/2022   Procedure: LEFT EYELID BLEPHAROPLASTY WITH PTOSIS REPAIR AND RIGHT EYELID BLEPHAROPLASTY WITH POSSIBLE PTOSIS REPAIR;  Surgeon: Rodman Clam, MD;  Location: [facility] OR;  Service: Plastics;  Laterality: Bilateral;  1.5 hours   COLONOSCOPY  2007   CORONARY ARTERY BYPASS GRAFT  01/18/2018   CABG 01/18/18: SVG-PDA, free RIMA-OM1-LAD with inflow from the LIMA as a Y graft, replacement of ascending aorta and hemi arch using 24 mm Gelweave vascular graft   CORONARY PRESSURE/FFR WITH 3D MAPPING N/A 05/15/2022   Procedure: Coronary Pressure Wire/FFR w/3D Mapping;  Surgeon: Swaziland, Peter M, MD;  Location: [facility] INVASIVE CV LAB;  Service: Cardiovascular;  Laterality: N/A;   CORONARY STENT INTERVENTION N/A 10/24/2021   Procedure: CORONARY STENT INTERVENTION;  Surgeon:  Swaziland, Peter M, MD;  [facility] INVASIVE CV LAB;  Service: Cardiovascular;  Laterality: N/A;   HIP ARTHROPLASTY     BILAT   LEFT HEART CATH AND CORONARY ANGIOGRAPHY N/A 05/15/2022   Procedure: LEFT HEART CATH AND CORONARY ANGIOGRAPHY;  Surgeon: Swaziland, Peter M, MD;  [facility] INVAS[facility] CV [facility];  Service: Cardiovascular;  Laterality: N/A;   NECK MASS EXCISION     RT SIDE LYMPH NODE REMOVED   ROTATOR CUFF REPAIR Right    SHOULDER ARTHROSCOPY WITH SUBACROMIAL DECOMPRESSION AND OPEN ROTATOR C Left 08/13/2015   Procedure: LEFT SHOULDER ARTHROSCOPY WITH SUBACROMIAL DECOMPRESSION AND MINI OPEN ROTATOR CUFF REPAIR, BICEPS TENODESIS AND OPEN DISTAL CLAVICLE RESECTION;  Surgeon: Winston Hawking, MD;  Location: [facility] OR;  Service: Orthopedics;  Laterality: Left;   TONSILLECTOMY     WRIST SURGERY     LEFT   Social History:  reports that he has never smoked. He has never used smokeless tobacco. He reports that he does not drink alcohol and does not use drugs.  Allergies  Allergen Reactions   Hydrochlorothiazide Other (See Comments)    Gout   Fluticasone     Other Reaction(s): Bleeding from nose    Family History  Problem Relation Age of Onset   Esophageal cancer Father    Colon cancer Neg Hx    Rectal cancer Neg Hx    Stomach cancer Neg Hx      Prior to Admission medications   Medication Sig Start Date End Date Taking? Authorizing  Provider  albuterol  (VENTOLIN  HFA) 108 (90 Base) MCG/ACT inhaler Inhale 2 puffs into the lungs every 6 (six) hours as needed for wheezing or shortness of breath. 06/02/22   Swaziland, Peter M, MD  aspirin  81 MG EC tablet Take 81 mg by mouth daily. 09/29/19   [provider]  atorvastatin  (LIPITOR) 20 MG tablet Take 20 mg by mouth every evening. 01/23/18   [provider]  carvedilol  (COREG ) 3.125 MG tablet Take 1 tablet (3.125 mg total) by mouth 2 (two) times daily. 10/10/21   Wendie Hamburg, MD  colchicine 0.6 MG tablet Take by mouth. 07/06/23    [provider]  fluorouracil (EFUDEX) 5 % cream Apply topically. 06/18/23   [provider]  lisinopril  (PRINIVIL ,ZESTRIL ) 10 MG tablet Take 10 mg by mouth daily.    [provider]  nitroGLYCERIN  (NITROSTAT ) 0.4 MG SL tablet Place 0.4 mg under the tongue every 5 (five) minutes as needed for chest pain. Patient not taking: Reported on 07/31/2023    [provider]  ondansetron  (ZOFRAN -ODT) 4 MG disintegrating tablet Take 1 tablet (4 mg total) by mouth every 8 (eight) hours as needed for nausea or vomiting. 03/07/22   Rodman Clam, MD  tamsulosin  (FLOMAX ) 0.4 MG CAPS capsule Take 0.4 mg by mouth daily after supper.    [provider]  Tiotropium Bromide Monohydrate  (SPIRIVA  RESPIMAT) 2.5 MCG/ACT AERS Inhale 2 puffs into the lungs daily. Patient not taking: Reported on 07/31/2023 06/02/22   [place], Peter M, MD    Physical Exam: BP (!) 130/54 (BP Location: Right Arm)   Pulse (!) 41   Temp (!) 97.4 F (36.3 C) (Oral)   Resp 17   Ht 6' (1.829 m)   Wt 72.6 kg   SpO2 100%   BMI 21.70 kg/m  General:  Alert, oriented, calm, in no acute distress, looks comfortable and nontoxic Eyes: EOMI, clear conjuctivae, white sclerea Neck: supple, no masses, trachea mildline  Cardiovascular: RRR, no murmurs or rubs, no peripheral edema  Respiratory: clear to auscultation bilaterally, no wheezes, no crackles  Abdomen: soft, nontender, nondistended, normal bowel tones heard  Skin: dry, no rashes  Musculoskeletal: Erythema and exquisite tenderness of the right index finger DIP joint          Labs on Admission:  Basic Metabolic Panel: Recent Labs  Lab 02/20/24 1018  NA 141  K 5.1  CL 112*  CO2 22  GLUCOSE 98  BUN 30*  CREATININE 1.17  CALCIUM  9.5   Liver Function Tests: No results for input(s): "AST", "ALT", "ALKPHOS", "BILITOT", "PROT", "ALBUMIN" in the last 168 hours. No results for input(s): "LIPASE", "AMYLASE" in the last 168 hours. No results  for input(s): "AMMONIA" in the last 168 hours. CBC: Recent Labs  Lab 02/20/24 1018  WBC 4.0  NEUTROABS 2.6  HGB 12.4*  HCT 39.8  MCV 97.1  PLT 134*   Cardiac Enzymes: No results for input(s): "CKTOTAL", "CKMB", "CKMBINDEX", "TROPONINI" in the last 168 hours. BNP (last 3 results) No results for input(s): "BNP" in the last 8760 hours.  ProBNP (last 3 results) No results for input(s): "PROBNP" in the last 8760 hours.  CBG: No results for input(s): "GLUCAP" in the last 168 hours.  Radiological Exams on Admission: DG Finger Index Right Result Date: 02/20/2024 CLINICAL DATA:  Swelling for 2 weeks EXAM: RIGHT INDEX FINGER 3V COMPARISON:  None Available. FINDINGS: Diffuse soft tissue swelling of the finger. No underlying fracture or dislocation. Advanced degenerative changes of the distal  interphalangeal joint with sclerosis, joint space loss and osteophytes. IMPRESSION: Diffuse soft tissue swelling. 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 Electronically Signed   By: Adrianna Horde M.D.   On: 02/20/2024 11:12   Assessment/Plan EVA VALLEE is a 78 y.o. male with medical history significant for hypertension, gout, CAD being admitted to the hospital with right index finger cellulitis and concern for abscess.   Right index finger cellulitis with concern for abscess-has already been seen by Ortho/hand surgery PA in the ER.  Patient is stable, with no evidence of sepsis. -Observation admission -Pain control -Empiric IV antibiotics -Anticipate operative I&D by hand surgery later this afternoon  Hyperlipidemia-Lipitor  Hypertension-lisinopril   DVT prophylaxis: Lovenox     Code Status: Full Code  Consults called: Hand surgery  Admission status: Observation  Time spent: 48 minutes  Easton Sivertson Rickey Charm MD [facility] Pager [phone]  If 7PM-7AM, please contact night-coverage [email or web address] Password [email or web address]  02/20/2024, 1:59 PM

## 2024-02-20 NOTE — Progress Notes (Signed)
 A consult was received from an ED physician for vancomycin and Zosyn per pharmacy dosing (for an indication other than meningitis). The patient's profile has been reviewed for ht/wt/allergies/indication/available labs. After discussing indication (finger tenosynovitis) and desired microbial coverage with EDP, will proceed with a one-time dose of vancomycin and Unasyn.  Further antibiotics/pharmacy consults should be ordered by admitting physician if indicated.                       Tera Fellows, PharmD, BCPS [phone] 02/20/2024, 10:53 AM

## 2024-02-20 NOTE — Consult Note (Addendum)
 Reason for Consult:Right index finger infection Referring Physician: Jerald Molly Time called: 1033 Time at bedside: 1146   [name] is an 78 y.o. male.  HPI: [name] comes to the ED with about 2 weeks of progressive right index finger pain. He states it started out of the blue and he had no wound or trauma. It has steadily worsened until it became unbearable and he sought evaluation. He notes prior e/o this that have resolved on their own. He denies fevers, chills, sweats, N/V.  Past Medical History:  Diagnosis Date   Adenomatous colon polyps 2007   Anal cancer (HCC) 2006   RADIATION/ CHEMO   Arthritis    Coronary artery disease    History of kidney stones    one time   Hypertension    Squamous cell skin cancer, multiple sites    Both arms    Past Surgical History:  Procedure Laterality Date   BROW LIFT Bilateral 05/03/2022   Procedure: LEFT EYELID BLEPHAROPLASTY WITH PTOSIS REPAIR AND RIGHT EYELID BLEPHAROPLASTY WITH POSSIBLE PTOSIS REPAIR;  Surgeon: Rodman Clam, MD;  Location: [facility] OR;  Service: Plastics;  Laterality: Bilateral;  1.5 hours   COLONOSCOPY  2007   CORONARY ARTERY BYPASS GRAFT  01/18/2018   CABG 01/18/18: SVG-PDA, free RIMA-OM1-LAD with inflow from the LIMA as a Y graft, replacement of ascending aorta and hemi arch using 24 mm Gelweave vascular graft   CORONARY PRESSURE/FFR WITH 3D MAPPING N/A 05/15/2022   Procedure: Coronary Pressure Wire/FFR w/3D Mapping;  Surgeon: Swaziland, Peter M, MD;  Location: [facility] INVASIVE CV LAB;  Service: Cardiovascular;  Laterality: N/A;   CORONARY STENT INTERVENTION N/A 10/24/2021   Procedure: CORONARY STENT INTERVENTION;  Surgeon: Swazil[place], Peter M, MD;  Location: [facility] INVASIVE CV LAB;  Service: Cardiovascular;  Laterality: N/A;   HIP ARTHROPLASTY     BILAT   LEFT HEART CATH AND CORONARY ANGIOGRAPHY N/A 05/15/2022   Procedure: LEFT HEART CATH AND CORONARY ANGIOGRAPHY;  Surgeon: Swaziland, Peter M, MD;  Location: [facility] INVASIVE CV LAB;   Service: Cardiovascular;  Laterality: N/A;   NECK MASS EXCISION     RT SIDE LYMPH NODE REMOVED   ROTATOR CUFF REPAIR Right    SHOULDER ARTHROSCOPY WITH SUBACROMIAL DECOMPRESSION AND OPEN ROTATOR C Left 08/13/2015   Procedure: LEFT SHOULDER ARTHROSCOPY WITH SUBACROMIAL DECOMPRESSION AND MINI OPEN ROTATOR CUFF REPAIR, BICEPS TENODESIS AND OPEN DISTAL CLAVICLE RESECTION;  Surgeon: Winston Hawking, MD;  Location: [facility] OR;  Service: Orthopedics;  Laterality: Left;   TONSILLECTOMY     WRIST SURGERY     LEFT    Family History  Problem Relation Age of Onset   Esophageal cancer Father    Colon cancer Neg Hx    Rectal cancer Neg Hx    Stomach cancer Neg Hx     Social History:  reports that he has never smoked. He has never used smokeless tobacco. He reports that he does not drink alcohol and does not use drugs.  Allergies:  Allergies  Allergen Reactions   Hydrochlorothiazide Other (See Comments)    Gout   Fluticasone     Other Reaction(s): Bleeding from nose    Medications: I have reviewed the patient's current medications.  Results for orders placed or performed during the hospital encounter of 02/20/24 (from the past 48 hours)  CBC with Differential     Status: Abnormal   Collection Time: 02/20/24 10:18 AM  Result Value Ref Range   WBC 4.0 4.0 - 10.5 K/uL   RBC  4.10 (L) 4.22 - 5.81 MIL/uL   Hemoglobin 12.4 (L) 13.0 - 17.0 g/dL   HCT 16.1 09.6 - 04.5 %   MCV 97.1 80.0 - 100.0 fL   MCH 30.2 26.0 - 34.0 pg   MCHC 31.2 30.0 - 36.0 g/dL   RDW 40.9 81.1 - 91.4 %   Platelets 134 (L) 150 - 400 K/uL   nRBC 0.0 0.0 - 0.2 %   Neutrophils Relative % 65 %   Neutro Abs 2.6 1.7 - 7.7 K/uL   Lymphocytes Relative 23 %   Lymphs Abs 0.9 0.7 - 4.0 K/uL   Monocytes Relative 8 %   Monocytes Absolute 0.3 0.1 - 1.0 K/uL   Eosinophils Relative 3 %   Eosinophils Absolute 0.1 0.0 - 0.5 K/uL   Basophils Relative 0 %   Basophils Absolute 0.0 0.0 - 0.1 K/uL   Immature Granulocytes 1 %   Abs Immature  Granulocytes 0.02 0.00 - 0.07 K/uL    Comment: Performed at [facility], [address] [place], [place] [address]    DG Finger Index Right Result Date: 02/20/2024 CLINICAL DATA:  Swelling for 2 weeks EXAM: RIGHT INDEX FINGER 3V COMPARISON:  None Available. FINDINGS: Diffuse soft tissue swelling of the finger. No underlying fracture or dislocation. 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 IMPRESSION: Diffuse soft tissue swelling. 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 Electronically Signed   By: Adrianna Horde M.D.   On: 02/20/2024 11:12    Review of Systems  Constitutional:  Negative for chills, diaphoresis and fever.  HENT:  Negative for ear discharge, ear pain, hearing loss and tinnitus.   Eyes:  Negative for photophobia and pain.  Respiratory:  Negative for cough and shortness of breath.   Cardiovascular:  Negative for chest pain.  Gastrointestinal:  Negative for abdominal pain, nausea and vomiting.  Genitourinary:  Negative for dysuria, flank pain, frequency and urgency.  Musculoskeletal:  Positive for arthralgias (Right index finger). Negative for back pain, myalgias and neck pain.  Neurological:  Negative for dizziness and headaches.  Hematological:  Does not bruise/bleed easily.  Psychiatric/Behavioral:  The patient is not nervous/anxious.    Blood pressure (!) 156/70, pulse (!) 50, temperature 97.7 F (36.5 C), temperature source Oral, resp. rate 16, height 6' (1.829 m), weight 72.6 kg, SpO2 100%. Physical Exam Constitutional:      General: He is not in acute distress.    Appearance: He is well-developed. He is not diaphoretic.  HENT:     Head: Normocephalic and atraumatic.  Eyes:     General: No scleral icterus.       Right eye: No discharge.        Left eye: No discharge.     Conjunctiva/sclera: Conjunctivae normal.  Cardiovascular:     Rate and  Rhythm: Normal rate and regular rhythm.  Pulmonary:     Effort: Pulmonary effort is normal. No respiratory distress.  Musculoskeletal:     Cervical back: Normal range of motion.     Comments: Right shoulder, elbow, wrist, digits- no skin wounds, severe TTP index finger P1, mod pain P2/3 and palm, held slightly flexed with fusiform edema. He will let me straighten but with significant pain, no instability, no blocks to motion  Sens  Ax/R/M/U intact but distal index finger numb  Mot   Ax/ R/ PIN/ M/ AIN/ U intact  Rad 2+  Skin:    General: Skin is  warm and dry.  Neurological:     Mental Status: He is alert.  Psychiatric:        Mood and Affect: Mood normal.        Behavior: Behavior normal.     Assessment/Plan: Right index finger pain -- Plan evaluation by Dr. Annamae Barrett and likely I&D this afternoon. Request medical admission for IV abx.  PT SEEN/EXAMINED AGREE WITH ABOVE PT WITH MARKED TENDERNESS AND PAIN OVER FLEXOR SHEATH WE SPOKE ABOUT FLEXOR SHEATH EXPLORATION MAY BE ABLE TO GO HOME AFTER SURGERY IF INFLAMMATORY AND NOT INFECTIOUS  R/B/A DISCUSSED WITH PT IN OFFICE.  PT VOICED UNDERSTANDING OF PLAN CONSENT SIGNED DAY OF SURGERY PT SEEN AND EXAMINED PRIOR TO OPERATIVE PROCEDURE/DAY OF SURGERY SITE MARKED. QUESTIONS ANSWERED  WE ARE PLANNING SURGERY FOR YOUR UPPER EXTREMITY. THE RISKS AND BENEFITS OF SURGERY INCLUDE BUT NOT LIMITED TO BLEEDING INFECTION, DAMAGE TO NEARBY NERVES ARTERIES TENDONS, FAILURE OF SURGERY TO ACCOMPLISH ITS INTENDED GOALS, PERSISTENT SYMPTOMS AND NEED FOR FURTHER SURGICAL INTERVENTION. WITH THIS IN MIND WE WILL PROCEED. I HAVE DISCUSSED WITH THE PATIENT THE PRE AND POSTOPERATIVE REGIMEN AND THE DOS AND DON'TS. PT VOICED UNDERSTANDING AND INFORMED CONSENT SIGNED.     Layton Tappan Annamae Barrett MD  Georganna Kin, PA-C Orthopedic Surgery [phone] 02/20/2024, 11:53 AM

## 2024-02-20 NOTE — Discharge Instructions (Signed)
KEEP BANDAGE CLEAN AND DRY CALL OFFICE FOR F/U APPT [phone] in 2 days KEEP HAND ELEVATED ABOVE HEART OK TO APPLY ICE TO OPERATIVE AREA CONTACT OFFICE IF ANY WORSENING PAIN OR CONCERNS.  

## 2024-02-20 NOTE — Progress Notes (Signed)
 Pharmacy Antibiotic Note  [name] is a 78 y.o. male admitted on 02/20/2024 with cellulitis.  Pharmacy has been consulted for vanc/unasyn dosing.  Plan: Vanc 1g x 1 already given, start 1250mg  IV q24 thereafter - goal AUC 400-550 Unasyn 3g IV q6  Height: 6' (182.9 cm) Weight: 72.6 kg (160 lb) IBW/kg (Calculated) : 77.6  Temp (24hrs), Avg:97.6 F (36.4 C), Min:97.4 F (36.3 C), Max:97.7 F (36.5 C)  Recent Labs  Lab 02/20/24 1018  WBC 4.0  CREATININE 1.17    Estimated Creatinine Clearance: 54.3 mL/min (by C-G formula based on SCr of 1.17 mg/dL).    Allergies  Allergen Reactions   Hydrochlorothiazide Other (See Comments)    Gout   Fluticasone     Other Reaction(s): Bleeding from nose      Thank you for allowing pharmacy to be a part of this patient's care.  Bernett Brill 02/20/2024 12:58 PM

## 2024-02-20 NOTE — Anesthesia Procedure Notes (Signed)
 Procedure Name: LMA Insertion Date/Time: 02/20/2024 4:15 PM  Performed by: Josetta Niece, CRNAPre-anesthesia Checklist: Patient identified, Emergency Drugs available, Suction available and Patient being monitored Patient Re-evaluated:Patient Re-evaluated prior to induction Oxygen Delivery Method: Circle System Utilized Preoxygenation: Pre-oxygenation with 100% oxygen Induction Type: IV induction Ventilation: Mask ventilation without difficulty LMA: LMA inserted LMA Size: 4.0 Number of attempts: 1 Placement Confirmation: positive ETCO2 Tube secured with: Tape Dental Injury: Teeth and Oropharynx as per pre-operative assessment

## 2024-02-20 NOTE — ED Triage Notes (Signed)
 Pt c/o increasing swelling to R index finger x2 weeks.  Pain score 7/10.  Pt denies cuts or drainage.  No significant redness noted to finger.    [facility]

## 2024-02-20 NOTE — Anesthesia Postprocedure Evaluation (Signed)
 Anesthesia Post Note  Patient: [name]  Procedure(s) Performed: IRRIGATION AND DEBRIDEMENT WOUND (Right)     Patient location during evaluation: PACU Anesthesia Type: General Level of consciousness: awake and alert Pain management: pain level controlled Vital Signs Assessment: post-procedure vital signs reviewed and stable Respiratory status: spontaneous breathing, nonlabored ventilation and respiratory function stable Cardiovascular status: blood pressure returned to baseline and stable Postop Assessment: no apparent nausea or vomiting Anesthetic complications: no   No notable events documented.                Mersadez Linden

## 2024-02-20 NOTE — Transfer of Care (Signed)
 Immediate Anesthesia Transfer of Care Note  Patient: [name]  Procedure(s) Performed: IRRIGATION AND DEBRIDEMENT WOUND (Right)  Patient Location: PACU  Anesthesia Type:General  Level of Consciousness: awake and alert   Airway & Oxygen Therapy: Patient Spontanous Breathing  Post-op Assessment: Report given to RN and Post -op Vital signs reviewed and stable  Post vital signs: Reviewed and stable  Last Vitals:  Vitals Value Taken Time  BP 162/65 02/20/24 1712  Temp    Pulse 52 02/20/24 1715  Resp 11 02/20/24 1715  SpO2 100 % 02/20/24 1715  Vitals shown include unfiled device data.  Last Pain:  Vitals:   02/20/24 1437  TempSrc:   PainSc: 8          Complications: No notable events documented.

## 2024-02-20 NOTE — Op Note (Addendum)
 PREOPERATIVE DIAGNOSIS: Right index finger flexor sheath pain and swelling  POSTOPERATIVE DIAGNOSIS: Same  ATTENDING SURGEON: Dr. Arvil Birks who scrubbed and present for the entire procedure  ASSISTANT SURGEON: None  ANESTHESIA: General Via LMA  OPERATIVE PROCEDURE: Right index finger flexor sheath exploration and tenosynovectomy flexor digitorum superficialis Right index finger flexor sheath exploration and tenosynovectomy flexor digitorum profundus Right index finger A1 pulley release  IMPLANTS: None  EBL: Minimal  RADIOGRAPHIC INTERPRETATION: None  SURGICAL INDICATIONS: Patient is a right-hand-dominant gentleman who had worsening pain and swelling of his right index finger.  Patient presented to the ER with a concern for a flexor tenosynovitis.  Risks of surgery include but not limited to bleeding infection damage nearby nerves arteries or tendons loss of motion of the wrist and digits incomplete relief of symptoms need for further surgical invention.  Signed informed consent was obtained on the day of surgery.  SURGICAL TECHNIQUE: Patient was palpated by the preoperative holding area marked the prior marker made of the right index finger indicate the correct operative site.  Patient then brought back the operating placed supine on the anesthesia table where the general anesthetic was administered.  Patient tolerates well.  Well-padded tourniquet then placed on the right brachium unsealed with the appropriate drape.  The right upper extremities then prepped and draped normal sterile fashion.  A timeout was called the correct site was identified procedure then began.  Oblique incision made directly over the A1 pulley to the index finger dissection carried down through the skin and subcutaneous tissue to identify the A1 pulley.  The A1 pulley was then released without any complicating features.  The flexor sheath was then explored there was not a significant fluid however the patient did  have the crystalline deposition along the course of both the FDS and FDP.  Aggressive tenosynovectomy was then carried out both tendons.  This was carried out to the level of the A2 pulley and then proximally within the palm.  Individual tenosynovectomy was had been carried out and the tissue analysis was sent and alcohol and 1 specimen sent on ice for gout crystal analysis.  The wound was thoroughly irrigated.  After copious wound irrigation the skin was then closed using simple Prolene suture.  Adaptic dressing sterile compressive bandage was applied.  The patient tolerated the procedure well.  POSTOPERATIVE PLAN: Patient be discharged to home.  I will see him back in the office in 2 days for wound check.  Likely is going to need outpatient management for gout go over the pathology results.  I did speak to the pathologist in detail about the specimen and she assured me that they would be analyzing the specimen for crystalline analysis to confirm the gouty tenosynovitis.

## 2024-02-20 NOTE — Anesthesia Preprocedure Evaluation (Signed)
 Anesthesia Evaluation    Reviewed: Allergy & Precautions, NPO status , Patient's Chart, lab work & pertinent test results  Airway Mallampati: II  TM Distance: >3 FB Neck ROM: Full    Dental  (+) Upper Dentures, Dental Advisory Given   Pulmonary shortness of breath and with exertion, neg sleep apnea, neg COPD, neg recent URI   breath sounds clear to auscultation       Cardiovascular hypertension, Pt. on medications and Pt. on home beta blockers (-) angina + CAD and + CABG   Rhythm:Regular   Mid Cx lesion is 40% stenosed.   Ost Cx lesion is 40% stenosed.   Prox LAD to Mid LAD lesion is 60% stenosed.   Mid RCA to Dist RCA lesion is 100% stenosed.   Non-stenotic Prox Cx lesion was previously treated.   LIMA graft was visualized by angiography and is normal in caliber.   SVG graft was visualized by angiography and is normal in caliber.   The graft exhibits no disease.   The graft exhibits no disease.   The left ventricular systolic function is normal.   LV end diastolic pressure is normal.   The left ventricular ejection fraction is 55-65% by visual estimate.   1. 2 vessel obstructive CAD.  2. Patent LIMA to the LAD 3. Patent SVG to distal RCA 4. Patent stent in the proximal LCx. The ostial disease is not hemodynamically significant with RFR 0.99 5. Normal LV function 6. Normal LVEDP   Plan: continue medical therapy. Will have to consider other causes of dyspnea.     Neuro/Psych negative neurological ROS  negative psych ROS   GI/Hepatic negative GI ROS,,,  Endo/Other  negative endocrine ROS    Renal/GU Lab Results      Component                Value               Date                      NA                       141                 02/20/2024                K                        5.1                 02/20/2024                CO2                      22                  02/20/2024                GLUCOSE                   98                  02/20/2024                BUN  30 (H)              02/20/2024                CREATININE               1.17                02/20/2024                CALCIUM                   9.5                 02/20/2024                EGFR                     54 (L)              05/09/2022                GFRNONAA                 >60                 02/20/2024                Musculoskeletal  (+) Arthritis ,    Abdominal   Peds  Hematology Lab Results      Component                Value               Date                      WBC                      4.0                 02/20/2024                HGB                      12.4 (L)            02/20/2024                HCT                      39.8                02/20/2024                MCV                      97.1                02/20/2024                PLT                      134 (L)             02/20/2024              Anesthesia Other Findings [name] is a 78 y.o. male who is seen for follow up CAD. He has a hx of CAD, hypertension, colon cancer.  He has a  history of CABG in 2019 by Dr Marinus Sic at [facility] with SVG-RCA, LIMA to LIMA to the LAD and apparent free RIMA forming Y graft to the OM branch. 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  He presented with complaints of short of breath, particularly with walking up hills.  He typically golfs 3 days/week but has been limited by shortness of breath. He also reports having episodes of dizziness, especially when he walks.  Denies any syncopal episodes.  Reports has had intermittent chest pain as well.  Describes as left-sided chest pain.  [facility] [place] [facility] on 08/31/2021, reportedly showing severe native CAD (40% left main, 50% proximal LAD, 75% mid LAD, 80% proximal LCx, 50% mid LCx, 70% proximal RCA, 80% mid RCA, 100% distal RCA, 80% right PDA), patent  SVG to RCA, LIMA to LAD patent but Y RIMA to the OM occluded.   He was referred for consideration of PCI to proximal LCx.    He did undergo successful PCI of the LCx on 10/24/21 with DES. Had a lot of bruising in his arm afterwards. He noted  a marked improvement in his breathing. Able to walk strenuously now and play golf without any limitations.    Reproductive/Obstetrics                              Anesthesia Physical Anesthesia Plan  ASA: 3  Anesthesia Plan: General   Post-op Pain Management: Ofirmev  IV (intra-op)*   Induction: Intravenous  PONV Risk Score and Plan: 3 and Ondansetron  and Dexamethasone   Airway Management Planned: LMA  Additional Equipment: None  Intra-op Plan:   Post-operative Plan: Extubation in OR  Informed Consent: I have reviewed the patients History and Physical, chart, labs and discussed the procedure including the risks, benefits and alternatives for the proposed anesthesia with the patient or authorized representative who has indicated his/her understanding and acceptance.     Dental advisory given  Plan Discussed with:   Anesthesia Plan Comments:          Anesthesia Quick Evaluation

## 2024-02-20 NOTE — ED Notes (Signed)
 Patient's heart rate is 40-42. He states this is his normal resting rate

## 2024-02-20 NOTE — ED Provider Notes (Signed)
 [employer] EMERGENCY DEPARTMENT AT [facility] Provider Note   CSN: [id number] Arrival date & time: 02/20/24  4098     History  Chief Complaint  Patient presents with   Finger Swelling    [name] is a 78 y.o. male.  The history is provided by the patient and medical records. No language interpreter was used.     78 year old male history of skin cancer, CAD, hypertension, kidney stone, arthritis, presenting with complaints of pain to his finger.  Patient report atraumatic pain and swelling involving his right index finger that started approximately 2 weeks ago.  Pain is described as a throbbing sensation worse with palpation.  He is now unable to flex his finger.  He is right-hand dominant.  He denies any fever no pain to his forearm no numbness and no chest pain.  No treatment tried.  He was seen at his PCP office today and was sent here for evaluation of his symptoms.  Home Medications Prior to Admission medications   Medication Sig Start Date End Date Taking? Authorizing Provider  albuterol  (VENTOLIN  HFA) 108 (90 Base) MCG/ACT inhaler Inhale 2 puffs into the lungs every 6 (six) hours as needed for wheezing or shortness of breath. 06/02/22   [place], Peter M, MD  aspirin  81 MG EC tablet Take 81 mg by mouth daily. 09/29/19   [provider]  atorvastatin  (LIPITOR) 20 MG tablet Take 20 mg by mouth every evening. 01/23/18   [provider]  carvedilol  (COREG ) 3.125 MG tablet Take 1 tablet (3.125 mg total) by mouth 2 (two) times daily. 10/10/21   Wendie Hamburg, MD  colchicine 0.6 MG tablet Take by mouth. 07/06/23   [provider]  fluorouracil (EFUDEX) 5 % cream Apply topically. 06/18/23   [provider]  lisinopril  (PRINIVIL ,ZESTRIL ) 10 MG tablet Take 10 mg by mouth daily.    [provider]  nitroGLYCERIN  (NITROSTAT ) 0.4 MG SL tablet Place 0.4 mg under the tongue every 5 (five) minutes as needed for chest  pain. Patient not taking: Reported on 07/31/2023    [provider]  ondansetron  (ZOFRAN -ODT) 4 MG disintegrating tablet Take 1 tablet (4 mg total) by mouth every 8 (eight) hours as needed for nausea or vomiting. 03/07/22   Rodman Clam, MD  tamsulosin  (FLOMAX ) 0.4 MG CAPS capsule Take 0.4 mg by mouth daily after supper.    [provider]  Tiotropium Bromide Monohydrate  (SPIRIVA  RESPIMAT) 2.5 MCG/ACT AERS Inhale 2 puffs into the lungs daily. Patient not taking: Reported on 07/31/2023 06/02/22   [place], Peter M, MD      Allergies    Hydrochlorothiazide and Fluticasone    Review of Systems   Review of Systems  Constitutional:  Negative for fever.  Skin:  Negative for wound.    Physical Exam Updated Vital Signs BP (!) 156/70 (BP Location: Right Arm)   Pulse (!) 50   Temp 97.7 F (36.5 C) (Oral)   Resp 16   Ht 6' (1.829 m)   Wt 72.6 kg   SpO2 100%   BMI 21.70 kg/m  Physical Exam Constitutional:      General: He is not in acute distress.    Appearance: He is well-developed.     Comments: Elderly male resting comfortably in the chair appears to be in no acute discomfort.  HENT:     Head: Atraumatic.  Eyes:     Conjunctiva/sclera: Conjunctivae normal.  Musculoskeletal:        General:  Tenderness (Right hand, index finger: Moderate edema noted throughout finger with tenderness to palpation and difficulty flexing the finger.  Tenderness to the palmar aspect as well.  No tenderness along the forearm.  Radial pulse 2+) present.     Cervical back: Normal range of motion and neck supple.  Skin:    Findings: No rash.  Neurological:     Mental Status: He is alert.           ED Results / Procedures / Treatments   Labs (all labs ordered are listed, but only abnormal results are displayed) Labs Reviewed  BASIC METABOLIC PANEL WITH GFR - Abnormal; Notable for the following components:      Result Value   Chloride 112 (*)    BUN 30 (*)    All other  components within normal limits  CBC WITH DIFFERENTIAL/PLATELET - Abnormal; Notable for the following components:   RBC 4.10 (*)    Hemoglobin 12.4 (*)    Platelets 134 (*)    All other components within normal limits  SEDIMENTATION RATE - Abnormal; Notable for the following components:   Sed Rate 24 (*)    All other components within normal limits  C-REACTIVE PROTEIN    EKG None  Radiology DG Finger Index Right Result Date: 02/20/2024 CLINICAL DATA:  Swelling for 2 weeks EXAM: RIGHT INDEX FINGER 3V COMPARISON:  None Available. FINDINGS: Diffuse soft tissue swelling of the finger. No underlying fracture or dislocation. 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 IMPRESSION: Diffuse soft tissue swelling. 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 Electronically Signed   By: Adrianna Horde M.D.   On: 02/20/2024 11:12    Procedures Procedures    Medications Ordered in ED Medications  chlorhexidine  (HIBICLENS ) 4 % liquid 4 Application (has no administration in time range)  povidone-iodine 10 % swab 2 Application (has no administration in time range)  ceFAZolin  (ANCEF ) IVPB 2g/100 mL premix (has no administration in time range)  morphine (PF) 4 MG/ML injection 4 mg (4 mg Intravenous Given 02/20/24 1047)  vancomycin (VANCOCIN) IVPB 1000 mg/200 mL premix (0 mg Intravenous Stopped 02/20/24 1228)  Ampicillin-Sulbactam (UNASYN) 3 g in sodium chloride  0.9 % 100 mL IVPB (0 g Intravenous Stopped 02/20/24 1141)    ED Course/ Medical Decision Making/ A&P                                 Medical Decision Making Amount and/or Complexity of Data Reviewed Labs: ordered. Radiology: ordered.  Risk Prescription drug management. Decision regarding hospitalization.   BP (!) 156/70 (BP Location: Right Arm)   Pulse (!) 50   Temp 97.7 F (36.5 C) (Oral)   Resp 16   Ht 6' (1.829 m)   Wt 72.6 kg   SpO2  100%   BMI 21.70 kg/m   80:26 AM  78 year old male history of skin cancer, CAD, hypertension, kidney stone, arthritis, presenting with complaints of pain to his finger.  Patient report atraumatic pain and swelling involving his right index finger that started approximately 2 weeks ago.  Pain is described as a throbbing sensation worse with palpation.  He is now unable to flex his finger.  He is right-hand dominant.  He denies any fever no pain to his forearm no numbness and no chest pain.  No treatment tried.  He was seen at his PCP office today and was  sent here for evaluation of his symptoms.  Exam notable for moderately edematous right index finger with inability to flex the finger secondary to pain.  Please refer to picture above for better visualization.  Presentation is suggestive of tenosynovitis.  He has brisk cap refill.  There are no signs of trauma.  At this time, will consult hand specialist further recommendation.  Labs ordered.  Care discussed with Dr. Gordon Latus.   -Labs ordered, independently viewed and interpreted by me.  Labs remarkable for sed rate 24 -The patient was maintained on a cardiac monitor.  I personally viewed and interpreted the cardiac monitored which showed an underlying rhythm of: sinus brady -Imaging independently viewed and interpreted by me and I agree with radiologist's interpretation.  Result remarkable for xray R index finger showing diffused soft tissue swelling -This patient presents to the ED for concern of finger pain, this involves an extensive number of treatment options, and is a complaint that carries with it a high risk of complications and morbidity.  The differential diagnosis includes tenosynovitis, septic joint, cellulitis, strain, sprain, fx, dislocation, gout -Co morbidities that complicate the patient evaluation includes skin CA, CAD, HTN -Treatment includes vanc, unasyn, morphine -Reevaluation of the patient after these medicines showed that the  patient improved -PCP office notes or outside notes reviewed -Discussion with specialist Hand specialist PA Steffanie Edouard who recommend medicine admission, transfer to [facility] for OR management by Dr. Annamae Barrett.  Appreciate consultation from [facility] Dr. Jannette Mend who agrees to admit pt -Escalation to admission/observation considered: patient is agreeable with admission        Final Clinical Impression(s) / ED Diagnoses Final diagnoses:  Tenosynovitis of finger    Rx / DC Orders ED Discharge Orders     None         Debbra Fairy, PA-C 02/20/24 1237    Arvilla Birmingham, MD 02/20/24 1530

## 2024-02-21 ENCOUNTER — Other Ambulatory Visit (HOSPITAL_COMMUNITY): Payer: Self-pay

## 2024-02-21 ENCOUNTER — Encounter (HOSPITAL_COMMUNITY): Payer: Self-pay | Admitting: Orthopedic Surgery

## 2024-02-21 DIAGNOSIS — M10041 Idiopathic gout, right hand: Secondary | ICD-10-CM | POA: Diagnosis not present

## 2024-02-21 LAB — BASIC METABOLIC PANEL WITH GFR
Anion gap: 9 (ref 5–15)
BUN: 27 mg/dL — ABNORMAL HIGH (ref 8–23)
CO2: 19 mmol/L — ABNORMAL LOW (ref 22–32)
Calcium: 9.2 mg/dL (ref 8.9–10.3)
Chloride: 110 mmol/L (ref 98–111)
Creatinine, Ser: 1.04 mg/dL (ref 0.61–1.24)
GFR, Estimated: >60 mL/min (ref >60)
Glucose, Bld: 144 mg/dL — ABNORMAL HIGH (ref 70–99)
Potassium: 4.4 mmol/L (ref 3.5–5.1)
Sodium: 138 mmol/L (ref 135–145)

## 2024-02-21 LAB — CBC
HCT: 36.3 % — ABNORMAL LOW (ref 39.0–52.0)
Hemoglobin: 11.5 g/dL — ABNORMAL LOW (ref 13.0–17.0)
MCH: 29.8 pg (ref 26.0–34.0)
MCHC: 31.7 g/dL (ref 30.0–36.0)
MCV: 94 fL (ref 80.0–100.0)
Platelets: 126 10*3/uL — ABNORMAL LOW (ref 150–400)
RBC: 3.86 MIL/uL — ABNORMAL LOW (ref 4.22–5.81)
RDW: 14.3 % (ref 11.5–15.5)
WBC: 5.2 10*3/uL (ref 4.0–10.5)
nRBC: 0 % (ref 0.0–0.2)

## 2024-02-21 MED ORDER — COLCHICINE 0.6 MG PO TABS
0.6000 mg | ORAL_TABLET | Freq: Every day | ORAL | 0 refills | Status: AC
  Filled 2024-02-21: qty 7, 7d supply, fill #0

## 2024-02-21 MED ORDER — COLCHICINE 0.6 MG PO TABS
1.2000 mg | ORAL_TABLET | Freq: Once | ORAL | Status: AC
  Administered 2024-02-21: 1.2 mg via ORAL
  Filled 2024-02-21: qty 2

## 2024-02-21 MED ORDER — COLCHICINE 0.6 MG PO TABS
0.6000 mg | ORAL_TABLET | Freq: Once | ORAL | Status: AC
  Administered 2024-02-21: 0.6 mg via ORAL
  Filled 2024-02-21: qty 1

## 2024-02-21 MED ORDER — OXYCODONE HCL 5 MG PO TABS
5.0000 mg | ORAL_TABLET | Freq: Four times a day (QID) | ORAL | 0 refills | Status: AC | PRN
  Filled 2024-02-21: qty 12, 3d supply, fill #0

## 2024-02-21 NOTE — Hospital Course (Addendum)
 78 year old man PMH gout presenting with right index finger pain for about 2 weeks.  Concern for infection and abscess, seen by hand surgery and underwent operative intervention.  Likely suffering from gout.  Discharged home without antibiotics per hand surgery for close outpatient follow-up.  Treat for gout.  Consultants Hand surgery  Procedures/Events 5/21   Right index finger flexor sheath exploration and tenosynovectomy flexor digitorum superficialis Right index finger flexor sheath exploration and tenosynovectomy flexor digitorum profundus Right index finger A1 pulley release

## 2024-02-21 NOTE — Progress Notes (Signed)
 Mobility Specialist - Progress Note   02/21/24 0843  Mobility  Activity Ambulated with assistance in hallway  Level of Assistance Modified independent, requires aide device or extra time  Assistive Device Other (Comment) (IV Pole)  Distance Ambulated (ft) 500 ft  Activity Response Tolerated well  Mobility Referral Yes  Mobility visit 1 Mobility  Mobility Specialist Start Time (ACUTE ONLY) [id number]  Mobility Specialist Stop Time (ACUTE ONLY) [id number]  Mobility Specialist Time Calculation (min) (ACUTE ONLY) 7 min   Pt received in bed and agreeable to mobility. No complaints during session. Pt to bathroom after session with all needs met.    [facility]

## 2024-02-21 NOTE — Plan of Care (Signed)
  Problem: Education: Goal: Knowledge of General Education information will improve Description: Including pain rating scale, medication(s)/side effects and non-pharmacologic comfort measures Outcome: Progressing   Problem: Clinical Measurements: Goal: Ability to maintain clinical measurements within normal limits will improve Outcome: Progressing Goal: Will remain free from infection Outcome: Progressing Goal: Diagnostic test results will improve Outcome: Progressing   Problem: Education: Goal: Knowledge of General Education information will improve Description: Including pain rating scale, medication(s)/side effects and non-pharmacologic comfort measures Outcome: Progressing   Problem: Clinical Measurements: Goal: Ability to maintain clinical measurements within normal limits will improve Outcome: Progressing Goal: Will remain free from infection Outcome: Progressing Goal: Diagnostic test results will improve Outcome: Progressing

## 2024-02-21 NOTE — Progress Notes (Signed)
 Assessment unchanged. Pt verbalized understanding of dc instructions including meds and follow up care. Discharged via foot accompanied by friend and NT.

## 2024-02-21 NOTE — Discharge Summary (Signed)
 Physician Discharge Summary   Patient: [name] MRN: [id number] DOB: 05/25/1946  Admit date:     02/20/2024  Discharge date: 02/21/24  Discharge Physician: Jerline Moon   PCP: [facility]   Recommendations at discharge:  Follow-up surgery, acute gout flare  Discharge Diagnoses: Principal Problem: Acute gout right index finger  Hospital Course: 78 year old man PMH gout presenting with right index finger pain for about 2 weeks.  Concern for infection and abscess, seen by hand surgery and underwent operative intervention.  Likely suffering from gout.  Discharged home without antibiotics per hand surgery for close outpatient follow-up.  Treat for gout.  Consultants Hand surgery  Procedures/Events 5/21   Right index finger flexor sheath exploration and tenosynovectomy flexor digitorum superficialis Right index finger flexor sheath exploration and tenosynovectomy flexor digitorum profundus Right index finger A1 pulley release  Acute gout right index finger Initially there was concern for cellulitis and abscess.  Intraoperatively exam was most consistent with gout.  Discussed with hand surgery today, no antibiotics recommended, treat for gout, discharged home with close outpatient follow-up. On further questioning the patient does have gout and has had gout in his feet before but never in the finger.   Thrombocytopenia  Seen intermittently 2023 and prior Follow-up as an outpatient   Normocytic anemia Mild, follow-up as an outpatient      Pain control - [place]  Controlled Substance Reporting System database was reviewed.   Disposition: Home Diet recommendation:  Discharge Diet Orders (From admission, onward)     Start     Ordered   02/21/24 0000  Diet general        02/21/24 1116           Regular diet DISCHARGE MEDICATION: Allergies as of 02/21/2024       Reactions   Hydrochlorothiazide Other (See Comments)   Gout   Fluticasone     Other Reaction(s): Bleeding from nose        Medication List     STOP taking these medications    aspirin  EC 81 MG tablet       TAKE these medications    albuterol  108 (90 Base) MCG/ACT inhaler Commonly known as: VENTOLIN  HFA Inhale 2 puffs into the lungs every 6 (six) hours as needed for wheezing or shortness of breath.   atorvastatin  20 MG tablet Commonly known as: LIPITOR Take 20 mg by mouth every evening.   carvedilol  3.125 MG tablet Commonly known as: COREG  Take 1 tablet (3.125 mg total) by mouth 2 (two) times daily.   colchicine 0.6 MG tablet Take 1 tablet (0.6 mg total) by mouth daily. What changed:  when to take this reasons to take this   fluorouracil 5 % cream Commonly known as: EFUDEX Apply topically.   lisinopril  10 MG tablet Commonly known as: ZESTRIL  Take 10 mg by mouth daily.   nitroGLYCERIN  0.4 MG SL tablet Commonly known as: NITROSTAT  Place 0.4 mg under the tongue every 5 (five) minutes as needed for chest pain.   ondansetron  4 MG disintegrating tablet Commonly known as: ZOFRAN -ODT Take 1 tablet (4 mg total) by mouth every 8 (eight) hours as needed for nausea or vomiting.   oxyCODONE  5 MG immediate release tablet Commonly known as: Oxy IR/ROXICODONE  Take 1 tablet (5 mg total) by mouth every 6 (six) hours as needed for moderate pain (pain score 4-6).   Spiriva  Respimat 2.5 MCG/ACT Aers Generic drug: Tiotropium Bromide Monohydrate  Inhale 2 puffs into the lungs daily.   tamsulosin   0.4 MG Caps capsule Commonly known as: FLOMAX  Take 0.4 mg by mouth daily after supper.        Follow-up Information     Arvil Birks, MD Follow up in 2 day(s).   Specialty: Orthopedic Surgery Contact information: [address] [place] [address] [place] [address] [phone]                Discharge Exam: Filed Weights   02/20/24 0913 02/20/24 1437 02/20/24 1712  Weight: 72.6 kg 72 kg 72 kg   Physical Exam Vitals reviewed.   Constitutional:      General: He is not in acute distress. Neurological:     Mental Status: He is alert.      Condition at discharge: good  The results of significant diagnostics from this hospitalization (including imaging, microbiology, ancillary and laboratory) are listed below for reference.   Imaging Studies: DG Finger Index Right Result Date: 02/20/2024 CLINICAL DATA:  Swelling for 2 weeks EXAM: RIGHT INDEX FINGER 3V COMPARISON:  None Available. FINDINGS: Diffuse soft tissue swelling of the finger. No underlying fracture or dislocation. 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 IMPRESSION: Diffuse soft tissue swelling. 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 Electronically Signed   By: Adrianna Horde M.D.   On: 02/20/2024 11:12    Microbiology: Results for orders placed or performed in visit on 10/01/19  Novel Coronavirus, NAA ([facility])     Status: None   Collection Time: 10/01/19 10:40 AM   Specimen: Nasopharyngeal(NP) swabs in vial transport medium   NASOPHARYNGE  TESTING  Result Value Ref Range Status   SARS-CoV-2, NAA Not Detected Not Detected Final    Comment: Testing was performed using the cobas(R) SARS-CoV-2 test. This nucleic acid amplification test was developed and its performance characteristics determined by [employer]. Nucleic acid amplification tests include PCR and TMA. This test has not been FDA cleared or approved. This test has been authorized by FDA under an Emergency Use Authorization (EUA). This test is only authorized for the duration of time the declaration that circumstances exist justifying the authorization of the emergency use of in vitro diagnostic tests for detection of SARS-CoV-2 virus and/or diagnosis of COVID-19 infection under section 564(b)(1) of the Act, 21 U.S.C. 413KGM-0(N) (1), unless the authorization is terminated or  revoked sooner. When diagnostic testing is negative, the possibility of a false negative result should be considered in the context of a patient's recent exposures and the presence of clinical signs and symptoms consistent with COVID-19. An individual without symptoms  of COVID-19 and who is not shedding SARS-CoV-2 virus would expect to have a negative (not detected) result in this assay.     Labs: CBC: Recent Labs  Lab 02/20/24 1018 02/21/24 0508  WBC 4.0 5.2  NEUTROABS 2.6  --   HGB 12.4* 11.5*  HCT 39.8 36.3*  MCV 97.1 94.0  PLT 134* 126*   Basic Metabolic Panel: Recent Labs  Lab 02/20/24 1018 02/21/24 0508  NA 141 138  K 5.1 4.4  CL 112* 110  CO2 22 19*  GLUCOSE 98 144*  BUN 30* 27*  CREATININE 1.17 1.04  CALCIUM  9.5 9.2   Liver Function Tests: No results for input(s): "AST", "ALT", "ALKPHOS", "BILITOT", "PROT", "ALBUMIN" in the last 168 hours. CBG: No results for input(s): "GLUCAP" in the last 168 hours.  Discharge time spent: greater than 30 minutes.  Signed: Jerline Moon, MD [facility] 02/21/2024

## 2024-02-21 NOTE — Plan of Care (Signed)

## 2024-02-22 LAB — SURGICAL PATHOLOGY

## 2024-04-15 ENCOUNTER — Other Ambulatory Visit (HOSPITAL_COMMUNITY): Payer: Self-pay

## 2024-08-13 ENCOUNTER — Other Ambulatory Visit: Payer: Self-pay

## 2024-08-20 ENCOUNTER — Other Ambulatory Visit: Payer: Self-pay
